# Patient Record
Sex: Female | Born: 1990 | Race: White | Hispanic: No | Marital: Single | Smoking: Former smoker
Health system: Southern US, Community
[De-identification: ages and names within clinical notes are randomized; demographics above are authoritative.]

## PROBLEM LIST (undated history)

## (undated) DIAGNOSIS — K219 Gastro-esophageal reflux disease without esophagitis: Secondary | ICD-10-CM

## (undated) DIAGNOSIS — O039 Complete or unspecified spontaneous abortion without complication: Secondary | ICD-10-CM

## (undated) DIAGNOSIS — F32A Depression, unspecified: Secondary | ICD-10-CM

## (undated) DIAGNOSIS — F419 Anxiety disorder, unspecified: Secondary | ICD-10-CM

## (undated) DIAGNOSIS — E039 Hypothyroidism, unspecified: Secondary | ICD-10-CM

## (undated) DIAGNOSIS — T7840XA Allergy, unspecified, initial encounter: Secondary | ICD-10-CM

## (undated) DIAGNOSIS — E079 Disorder of thyroid, unspecified: Secondary | ICD-10-CM

## (undated) HISTORY — PX: TONSILLECTOMY: SUR1361

## (undated) HISTORY — DX: Allergy, unspecified, initial encounter: T78.40XA

## (undated) HISTORY — DX: Gastro-esophageal reflux disease without esophagitis: K21.9

## (undated) HISTORY — DX: Depression, unspecified: F32.A

---
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
## 2011-01-06 ENCOUNTER — Encounter (INDEPENDENT_AMBULATORY_CARE_PROVIDER_SITE_OTHER): Payer: 59 | Admitting: Psychiatry

## 2011-01-06 DIAGNOSIS — F909 Attention-deficit hyperactivity disorder, unspecified type: Secondary | ICD-10-CM

## 2011-01-06 DIAGNOSIS — F39 Unspecified mood [affective] disorder: Secondary | ICD-10-CM

## 2011-02-03 ENCOUNTER — Encounter (HOSPITAL_COMMUNITY): Payer: 59 | Admitting: Psychiatry

## 2011-08-10 ENCOUNTER — Ambulatory Visit (HOSPITAL_COMMUNITY): Payer: 59 | Admitting: Psychiatry

## 2011-08-11 ENCOUNTER — Ambulatory Visit (HOSPITAL_COMMUNITY): Payer: 59 | Admitting: Psychiatry

## 2012-06-14 ENCOUNTER — Encounter (HOSPITAL_BASED_OUTPATIENT_CLINIC_OR_DEPARTMENT_OTHER): Payer: Self-pay | Admitting: *Deleted

## 2012-06-14 ENCOUNTER — Emergency Department (HOSPITAL_BASED_OUTPATIENT_CLINIC_OR_DEPARTMENT_OTHER)
Admission: EM | Admit: 2012-06-14 | Discharge: 2012-06-14 | Disposition: A | Discharge status: Home / Self Care | Payer: Managed Care, Other (non HMO) | Attending: Emergency Medicine | Admitting: Emergency Medicine

## 2012-06-14 ENCOUNTER — Emergency Department (HOSPITAL_BASED_OUTPATIENT_CLINIC_OR_DEPARTMENT_OTHER): Payer: Managed Care, Other (non HMO)

## 2012-06-14 DIAGNOSIS — R42 Dizziness and giddiness: Secondary | ICD-10-CM | POA: Insufficient documentation

## 2012-06-14 DIAGNOSIS — E079 Disorder of thyroid, unspecified: Secondary | ICD-10-CM | POA: Insufficient documentation

## 2012-06-14 DIAGNOSIS — S8990XA Unspecified injury of unspecified lower leg, initial encounter: Secondary | ICD-10-CM | POA: Insufficient documentation

## 2012-06-14 DIAGNOSIS — M79673 Pain in unspecified foot: Secondary | ICD-10-CM

## 2012-06-14 DIAGNOSIS — Z79899 Other long term (current) drug therapy: Secondary | ICD-10-CM | POA: Insufficient documentation

## 2012-06-14 DIAGNOSIS — M25579 Pain in unspecified ankle and joints of unspecified foot: Secondary | ICD-10-CM

## 2012-06-14 DIAGNOSIS — Y9389 Activity, other specified: Secondary | ICD-10-CM | POA: Insufficient documentation

## 2012-06-14 HISTORY — DX: Disorder of thyroid, unspecified: E07.9

## 2012-06-14 MED ORDER — HYDROCODONE-ACETAMINOPHEN 5-325 MG PO TABS: 2.0000 | ORAL_TABLET | ORAL | Status: DC | PRN

## 2012-06-14 MED ORDER — HYDROCODONE-ACETAMINOPHEN 5-325 MG PO TABS
1.0000 | ORAL_TABLET | Freq: Once | ORAL | Status: AC
  Administered 2012-06-14: 1 via ORAL
  Filled 2012-06-14: qty 1
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
## 2012-06-14 NOTE — ED Provider Notes (Signed)
Medical screening examination/treatment/procedure(s) were performed by non-physician practitioner and as supervising physician I was immediately available for consultation/collaboration.   Anu Stagner, MD 06/14/12 2317 

## 2012-11-14 ENCOUNTER — Other Ambulatory Visit: Payer: Self-pay | Admitting: Family Medicine

## 2012-11-14 DIAGNOSIS — R102 Pelvic and perineal pain: Secondary | ICD-10-CM

## 2012-11-21 ENCOUNTER — Other Ambulatory Visit: Payer: Managed Care, Other (non HMO)

## 2012-12-05 ENCOUNTER — Ambulatory Visit
Admission: RE | Admit: 2012-12-05 | Discharge: 2012-12-05 | Disposition: A | Discharge status: Home / Self Care | Payer: Managed Care, Other (non HMO) | Source: Ambulatory Visit | Attending: Family Medicine | Admitting: Family Medicine

## 2012-12-05 DIAGNOSIS — R102 Pelvic and perineal pain: Secondary | ICD-10-CM

## 2013-10-31 ENCOUNTER — Encounter (HOSPITAL_COMMUNITY): Payer: Self-pay | Admitting: *Deleted

## 2013-11-02 ENCOUNTER — Encounter (HOSPITAL_COMMUNITY): Payer: Self-pay | Admitting: Pharmacist

## 2013-11-02 MED ORDER — DOXYCYCLINE HYCLATE 100 MG IV SOLR
100.0000 mg | Freq: Once | INTRAVENOUS | Status: AC
  Administered 2013-11-03: 100 mg via INTRAVENOUS
  Filled 2013-11-02: qty 100
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
## 2013-11-03 ENCOUNTER — Encounter (HOSPITAL_COMMUNITY): Payer: Managed Care, Other (non HMO) | Admitting: Anesthesiology

## 2013-11-03 ENCOUNTER — Ambulatory Visit (HOSPITAL_COMMUNITY): Payer: Managed Care, Other (non HMO) | Admitting: Anesthesiology

## 2013-11-03 ENCOUNTER — Encounter (HOSPITAL_COMMUNITY): Payer: Self-pay | Admitting: Anesthesiology

## 2013-11-03 ENCOUNTER — Ambulatory Visit (HOSPITAL_COMMUNITY)
Admission: RE | Admit: 2013-11-03 | Discharge: 2013-11-03 | Disposition: A | Discharge status: Home / Self Care | Payer: Managed Care, Other (non HMO) | Source: Ambulatory Visit | Attending: Obstetrics & Gynecology | Admitting: Obstetrics & Gynecology

## 2013-11-03 ENCOUNTER — Encounter (HOSPITAL_COMMUNITY)
Admission: RE | Disposition: A | Discharge status: Home / Self Care | Payer: Self-pay | Source: Ambulatory Visit | Attending: Obstetrics & Gynecology

## 2013-11-03 DIAGNOSIS — E0500 Thyrotoxicosis with diffuse goiter without thyrotoxic crisis or storm: Secondary | ICD-10-CM | POA: Insufficient documentation

## 2013-11-03 DIAGNOSIS — F411 Generalized anxiety disorder: Secondary | ICD-10-CM | POA: Insufficient documentation

## 2013-11-03 DIAGNOSIS — O021 Missed abortion: Secondary | ICD-10-CM | POA: Insufficient documentation

## 2013-11-03 DIAGNOSIS — E039 Hypothyroidism, unspecified: Secondary | ICD-10-CM | POA: Insufficient documentation

## 2013-11-03 HISTORY — DX: Complete or unspecified spontaneous abortion without complication: O03.9

## 2013-11-03 HISTORY — DX: Anxiety disorder, unspecified: F41.9

## 2013-11-03 HISTORY — PX: DILATION AND EVACUATION: SHX1459

## 2013-11-03 HISTORY — DX: Hypothyroidism, unspecified: E03.9

## 2013-11-03 LAB — CBC
HEMATOCRIT: 41.6 % (ref 36.0–46.0)
Hemoglobin: 14.6 g/dL (ref 12.0–15.0)
MCH: 30.4 pg (ref 26.0–34.0)
MCHC: 35.1 g/dL (ref 30.0–36.0)
MCV: 86.7 fL (ref 78.0–100.0)
Platelets: 292 10*3/uL (ref 150–400)
RBC: 4.8 MIL/uL (ref 3.87–5.11)
RDW: 12.2 % (ref 11.5–15.5)
WBC: 10.9 10*3/uL — AB (ref 4.0–10.5)

## 2013-11-03 SURGERY — DILATION AND EVACUATION, UTERUS
Anesthesia: Monitor Anesthesia Care | Site: Vagina

## 2013-11-03 MED ORDER — OXYCODONE-ACETAMINOPHEN 5-325 MG PO TABS
1.0000 | ORAL_TABLET | ORAL | Status: DC | PRN
  Administered 2013-11-03: 1 via ORAL

## 2013-11-03 MED ORDER — ALPRAZOLAM 0.5 MG PO TABS: 0.5000 mg | ORAL_TABLET | Freq: Three times a day (TID) | ORAL | Status: DC | PRN

## 2013-11-03 MED ORDER — CHLOROPROCAINE HCL 1 % IJ SOLN
INTRAMUSCULAR | Status: AC
  Filled 2013-11-03: qty 30

## 2013-11-03 MED ORDER — FENTANYL CITRATE 0.05 MG/ML IJ SOLN: 25.0000 ug | INTRAMUSCULAR | Status: DC | PRN

## 2013-11-03 MED ORDER — LIDOCAINE HCL (CARDIAC) 20 MG/ML IV SOLN
INTRAVENOUS | Status: AC
  Filled 2013-11-03: qty 5

## 2013-11-03 MED ORDER — FENTANYL CITRATE 0.05 MG/ML IJ SOLN
INTRAMUSCULAR | Status: AC
  Filled 2013-11-03: qty 2

## 2013-11-03 MED ORDER — DOXYCYCLINE HYCLATE 100 MG PO TABS
200.0000 mg | ORAL_TABLET | Freq: Every day | ORAL | Status: AC
  Administered 2013-11-03: 200 mg via ORAL

## 2013-11-03 MED ORDER — MIDAZOLAM HCL 2 MG/2ML IJ SOLN
INTRAMUSCULAR | Status: DC | PRN
  Administered 2013-11-03: 2 mg via INTRAVENOUS

## 2013-11-03 MED ORDER — LIDOCAINE HCL (CARDIAC) 20 MG/ML IV SOLN
INTRAVENOUS | Status: DC | PRN
  Administered 2013-11-03: 80 mg via INTRAVENOUS

## 2013-11-03 MED ORDER — KETOROLAC TROMETHAMINE 30 MG/ML IJ SOLN: 15.0000 mg | Freq: Once | INTRAMUSCULAR | Status: DC | PRN

## 2013-11-03 MED ORDER — PROPOFOL 10 MG/ML IV BOLUS
INTRAVENOUS | Status: DC | PRN
  Administered 2013-11-03 (×2): 20 mg via INTRAVENOUS
  Administered 2013-11-03: 10 mg via INTRAVENOUS
  Administered 2013-11-03: 130 mg via INTRAVENOUS
  Administered 2013-11-03: 20 mg via INTRAVENOUS

## 2013-11-03 MED ORDER — IBUPROFEN 800 MG PO TABS: 800.0000 mg | ORAL_TABLET | Freq: Four times a day (QID) | ORAL | Status: DC | PRN

## 2013-11-03 MED ORDER — DEXAMETHASONE SODIUM PHOSPHATE 10 MG/ML IJ SOLN
INTRAMUSCULAR | Status: DC | PRN
  Administered 2013-11-03: 10 mg via INTRAVENOUS

## 2013-11-03 MED ORDER — DOXYCYCLINE HYCLATE 100 MG PO TABS
ORAL_TABLET | ORAL | Status: AC
  Administered 2013-11-03: 200 mg via ORAL
  Filled 2013-11-03: qty 2

## 2013-11-03 MED ORDER — DEXAMETHASONE SODIUM PHOSPHATE 10 MG/ML IJ SOLN
INTRAMUSCULAR | Status: AC
  Filled 2013-11-03: qty 1

## 2013-11-03 MED ORDER — DEXTROSE IN LACTATED RINGERS 5 % IV SOLN: INTRAVENOUS | Status: DC

## 2013-11-03 MED ORDER — ONDANSETRON HCL 4 MG/2ML IJ SOLN
INTRAMUSCULAR | Status: DC | PRN
  Administered 2013-11-03: 4 mg via INTRAVENOUS

## 2013-11-03 MED ORDER — PROPOFOL INFUSION 10 MG/ML OPTIME
INTRAVENOUS | Status: DC | PRN
  Administered 2013-11-03: 120 ug/kg/min via INTRAVENOUS

## 2013-11-03 MED ORDER — OXYCODONE-ACETAMINOPHEN 5-325 MG PO TABS
ORAL_TABLET | ORAL | Status: AC
  Filled 2013-11-03: qty 1

## 2013-11-03 MED ORDER — MEPERIDINE HCL 25 MG/ML IJ SOLN
6.2500 mg | INTRAMUSCULAR | Status: DC | PRN
Start: 2013-11-03 — End: 2013-11-03

## 2013-11-03 MED ORDER — KETOROLAC TROMETHAMINE 30 MG/ML IJ SOLN
INTRAMUSCULAR | Status: DC | PRN
  Administered 2013-11-03: 30 mg via INTRAVENOUS

## 2013-11-03 MED ORDER — MIDAZOLAM HCL 2 MG/2ML IJ SOLN
INTRAMUSCULAR | Status: AC
  Filled 2013-11-03: qty 2

## 2013-11-03 MED ORDER — PROMETHAZINE HCL 25 MG/ML IJ SOLN: 6.2500 mg | INTRAMUSCULAR | Status: DC | PRN

## 2013-11-03 MED ORDER — OXYCODONE-ACETAMINOPHEN 7.5-325 MG PO TABS: 1.0000 | ORAL_TABLET | ORAL | Status: DC | PRN

## 2013-11-03 MED ORDER — ONDANSETRON HCL 4 MG PO TABS: 4.0000 mg | ORAL_TABLET | Freq: Three times a day (TID) | ORAL | Status: DC | PRN

## 2013-11-03 MED ORDER — MIDAZOLAM HCL 2 MG/2ML IJ SOLN: 0.5000 mg | Freq: Once | INTRAMUSCULAR | Status: DC | PRN

## 2013-11-03 MED ORDER — LACTATED RINGERS IV SOLN
INTRAVENOUS | Status: DC
  Administered 2013-11-03: 13:00:00 via INTRAVENOUS

## 2013-11-03 MED ORDER — PROPOFOL 10 MG/ML IV EMUL
INTRAVENOUS | Status: AC
  Filled 2013-11-03: qty 40

## 2013-11-03 MED ORDER — FENTANYL CITRATE 0.05 MG/ML IJ SOLN
INTRAMUSCULAR | Status: DC | PRN
  Administered 2013-11-03 (×2): 50 ug via INTRAVENOUS
  Administered 2013-11-03: 100 ug via INTRAVENOUS

## 2013-11-03 MED ORDER — CHLOROPROCAINE HCL 1 % IJ SOLN
INTRAMUSCULAR | Status: DC | PRN
  Administered 2013-11-03: 30 mL

## 2013-11-03 MED ORDER — ONDANSETRON HCL 4 MG/2ML IJ SOLN
INTRAMUSCULAR | Status: AC
  Filled 2013-11-03: qty 2

## 2013-11-03 MED ORDER — KETOROLAC TROMETHAMINE 30 MG/ML IJ SOLN: INTRAMUSCULAR | Status: DC | PRN

## 2013-11-03 SURGICAL SUPPLY — 21 items
CATH ROBINSON RED A/P 16FR (CATHETERS) ×3 IMPLANT
CLOTH BEACON ORANGE TIMEOUT ST (SAFETY) ×3 IMPLANT
DECANTER SPIKE VIAL GLASS SM (MISCELLANEOUS) ×3 IMPLANT
GLOVE BIO SURGEON STRL SZ 6 (GLOVE) ×3 IMPLANT
GLOVE BIOGEL PI IND STRL 6 (GLOVE) ×2 IMPLANT
GLOVE BIOGEL PI INDICATOR 6 (GLOVE) ×4
GOWN STRL REUS W/TWL LRG LVL3 (GOWN DISPOSABLE) ×6 IMPLANT
KIT BERKELEY 1ST TRIMESTER 3/8 (MISCELLANEOUS) ×3 IMPLANT
NDL SPNL 22GX3.5 QUINCKE BK (NEEDLE) ×1 IMPLANT
NEEDLE SPNL 22GX3.5 QUINCKE BK (NEEDLE) ×3 IMPLANT
NS IRRIG 1000ML POUR BTL (IV SOLUTION) ×3 IMPLANT
PACK VAGINAL MINOR WOMEN LF (CUSTOM PROCEDURE TRAY) ×3 IMPLANT
PAD OB MATERNITY 4.3X12.25 (PERSONAL CARE ITEMS) ×3 IMPLANT
PAD PREP 24X48 CUFFED NSTRL (MISCELLANEOUS) ×3 IMPLANT
SET BERKELEY SUCTION TUBING (SUCTIONS) ×3 IMPLANT
SYR CONTROL 10ML LL (SYRINGE) ×3 IMPLANT
TOWEL OR 17X24 6PK STRL BLUE (TOWEL DISPOSABLE) ×6 IMPLANT
VACURETTE 10 RIGID CVD (CANNULA) IMPLANT
VACURETTE 7MM CVD STRL WRAP (CANNULA) IMPLANT
VACURETTE 8 RIGID CVD (CANNULA) IMPLANT
VACURETTE 9 RIGID CVD (CANNULA) ×2 IMPLANT

## 2013-11-03 NOTE — Op Note (Signed)
Patient: Michele Bullock DOB: Mar 02, 1991 PREOPERATIVE DIAGNOSIS: Missed abortion at 8 weeks  POSTOPERATIVE DIAGNOSIS: The same  PROCEDURE: Suction Dilation and Curettage.  SURGEON: Dr. Mitchel HonourMegan Everlyn Farabaugh  INDICATIONS: 23 y.o. G2P0 here for scheduled surgery for suction D&C for missed abortion at 8 weeks. Risks of surgery were discussed with the patient including but not limited to: bleeding which may require transfusion; infection which may require antibiotics; injury to uterus or surrounding organs; intrauterine scarring which may impair future fertility; need for additional procedures including laparotomy or laparoscopy; and other postoperative/anesthesia complications. Written informed consent was obtained.  FINDINGS: A 8 week size uterus.  ANESTHESIA: General  ESTIMATED BLOOD LOSS: 100cc  SPECIMENS: Products of conception sent for genetic testing COMPLICATIONS: None immediate.  PROCEDURE DETAILS: The patient received intravenous antibiotics while in the preoperative area. She was then taken to the operating room where general anesthesia was administered and was found to be adequate. After an adequate timeout was performed, she was placed in the dorsal lithotomy position and examined; then prepped and draped in the sterile manner. Her bladder was catheterized for an unmeasured amount of clear, yellow urine. A speculum was then placed in the patient's vagina and a single tooth tenaculum was applied to the anterior lip of the cervix. The uteus was dilated manually with Pratt cervical dilators up to 27 [place]. Once the cervix was dilated, 9 mm suction curette was advanced and suction attached. Three suction curettage passes were performed which did remove tissue. A sharp curettage was then performed to obtain a scant amount of endometrial curettings and revealed a gritty texture circumferentially. The tenaculum was removed from the anterior lip of the cervix and the vaginal speculum was removed after noting good  hemostasis. The patient tolerated the procedure well and was taken to the recovery area awake, extubated and in stable condition.

## 2013-11-03 NOTE — Progress Notes (Signed)
No change to H&P. 

## 2013-11-03 NOTE — Transfer of Care (Signed)
Immediate Anesthesia Transfer of Care Note  Patient: Michele Bullock  Procedure(s) Performed: Procedure(s) with comments: DILATATION AND EVACUATION with genetic studies (N/A) - with genetic studies  Patient Location: PACU  Anesthesia Type:General  Level of Consciousness: awake, alert  and oriented  Airway & Oxygen Therapy: Patient Spontanous Breathing  Post-op Assessment: Report given to PACU RN and Post -op Vital signs reviewed and stable  Post vital signs: Reviewed and stable  Complications: No apparent anesthesia complications

## 2013-11-03 NOTE — Anesthesia Preprocedure Evaluation (Signed)
Anesthesia Evaluation  Patient identified by MRN, date of birth, ID band Patient awake    Reviewed: Allergy & Precautions, H&P , Patient's Chart, lab work & pertinent test results, reviewed documented beta blocker date and time   History of Anesthesia Complications Negative for: history of anesthetic complications  Airway Mallampati: II TM Distance: >3 FB Neck ROM: full    Dental   Pulmonary  breath sounds clear to auscultation        Cardiovascular Exercise Tolerance: Good Rhythm:regular Rate:Normal     Neuro/Psych PSYCHIATRIC DISORDERS Anxiety negative psych ROS   GI/Hepatic   Endo/Other  Hypothyroidism   Renal/GU      Musculoskeletal   Abdominal   Peds  Hematology   Anesthesia Other Findings   Reproductive/Obstetrics                           Anesthesia Physical Anesthesia Plan  ASA: II  Anesthesia Plan: MAC   Post-op Pain Management:    Induction:   Airway Management Planned:   Additional Equipment:   Intra-op Plan:   Post-operative Plan:   Informed Consent: I have reviewed the patients History and Physical, chart, labs and discussed the procedure including the risks, benefits and alternatives for the proposed anesthesia with the patient or authorized representative who has indicated his/her understanding and acceptance.   Dental Advisory Given  Plan Discussed with: CRNA, Surgeon and Anesthesiologist  Anesthesia Plan Comments:         Anesthesia Quick Evaluation

## 2013-11-03 NOTE — Discharge Instructions (Signed)
Call MD for T>100.4, severe abdominal pain, intractable nausea and/or vomiting, or heavy vaginal bleeding.  Call office to schedule postop appointment in 2 weeks.  No driving while taking narcotics.  Pelvic rest x 2 weeks.DISCHARGE INSTRUCTIONS: D&C / D&E The following instructions have been prepared to help you care for yourself upon your return home.   Personal hygiene:  Use sanitary pads for vaginal drainage, not tampons.  Shower the day after your procedure.  NO tub baths, pools or Jacuzzis for 2-3 weeks.  Wipe front to back after using the bathroom.  Activity and limitations:  Do NOT drive or operate any equipment for 24 hours. The effects of anesthesia are still present and drowsiness may result.  Do NOT rest in bed all day.  Walking is encouraged.  Walk up and down stairs slowly.  You may resume your normal activity in one to two days or as indicated by your physician.  Sexual activity: NO intercourse for at least 2 weeks after the procedure, or as indicated by your physician.  Diet: Eat a light meal as desired this evening. You may resume your usual diet tomorrow.  Return to work: You may resume your work activities in one to two days or as indicated by your doctor.  What to expect after your surgery: Expect to have vaginal bleeding/discharge for 2-3 days and spotting for up to 10 days. It is not unusual to have soreness for up to 1-2 weeks. You may have a slight burning sensation when you urinate for the first day. Mild cramps may continue for a couple of days. You may have a regular period in 2-6 weeks.  Call your doctor for any of the following:  Excessive vaginal bleeding, saturating and changing one pad every hour.  Inability to urinate 6 hours after discharge from hospital.  Pain not relieved by pain medication.  Fever of 100.4 F or greater.  Unusual vaginal discharge or odor.   Call for an appointment:    Patients signature:  ______________________  Nurses signature ________________________  Support person's signature_______________________

## 2013-11-06 ENCOUNTER — Encounter (HOSPITAL_COMMUNITY): Payer: Self-pay | Admitting: Obstetrics & Gynecology

## 2013-11-08 NOTE — Anesthesia Postprocedure Evaluation (Signed)
  Anesthesia Post Note  Patient: Michele Bullock  Procedure(s) Performed: Procedure(s) (LRB): DILATATION AND EVACUATION with genetic studies (N/A)  Anesthesia type: GA  Patient location: PACU  Post pain: Pain level controlled  Post assessment: Post-op Vital signs reviewed  Last Vitals:  Filed Vitals:   11/03/13 1506  BP:   Pulse: 84  Temp: 36.7 C  Resp: 16    Post vital signs: Reviewed  Level of consciousness: sedated  Complications: No apparent anesthesia complications

## 2013-11-20 LAB — CHROMOSOME STD, POC(TISSUE)-NCBH

## 2014-05-21 ENCOUNTER — Encounter (HOSPITAL_COMMUNITY): Payer: Self-pay | Admitting: Obstetrics & Gynecology

## 2014-09-28 IMAGING — US US PELVIS COMPLETE
1 series · 14 of 25 positions shown · non-contrast
Comparison: 05/13/2010.

CLINICAL DATA: Right pelvic pain.

TRANSABDOMINAL AND TRANSVAGINAL ULTRASOUND OF PELVIS
TECHNIQUE: Both transabdominal and transvaginal ultrasound
examinations of the pelvis were performed. Transabdominal technique
was performed for global imaging of the pelvis including uterus,
ovaries, adnexal regions, and pelvic cul-de-sac.
It was necessary to proceed with endovaginal exam following the
transabdominal exam to visualize the uterus, endometrium, ovaries
and adnexal regions.

[Series 1: us pelvis complete · 0.19mm/px · 14 of 83 slices shown]
[im 1/83]
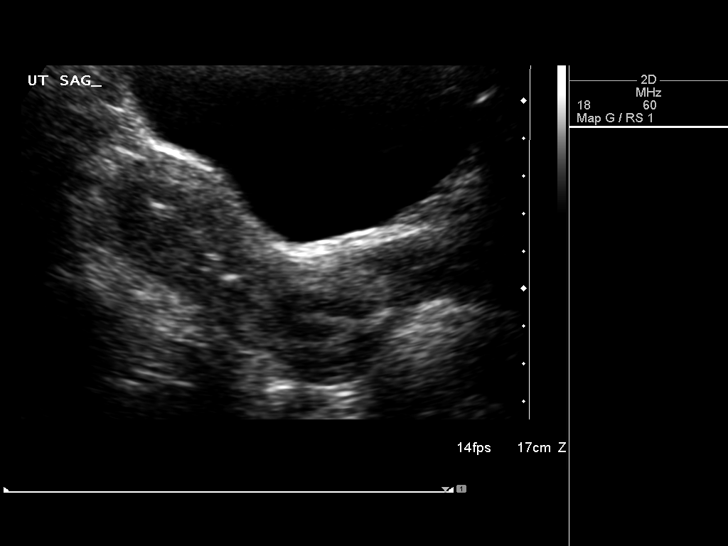
[im 7/83]
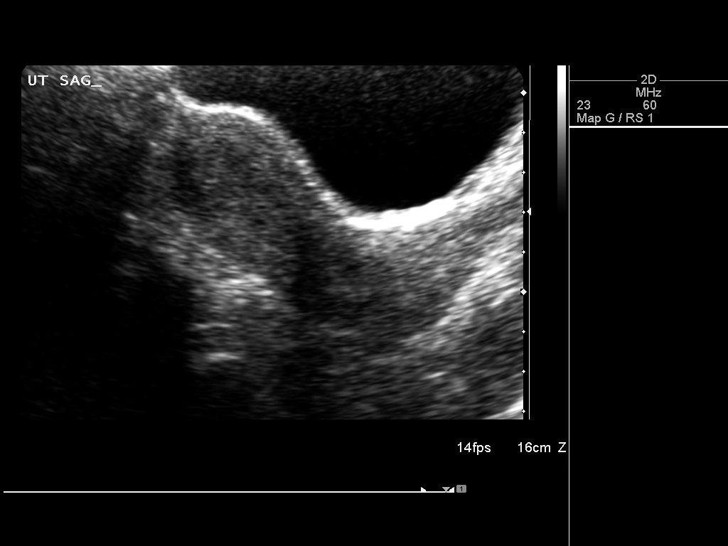
[im 14/83]
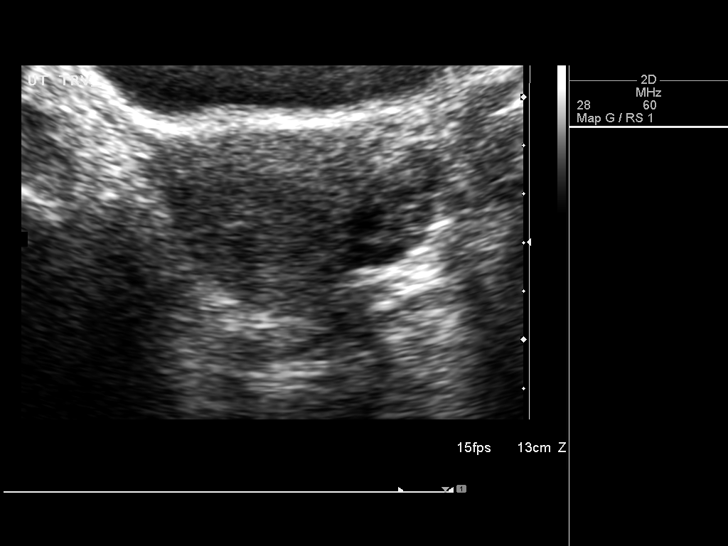
[im 21/83]
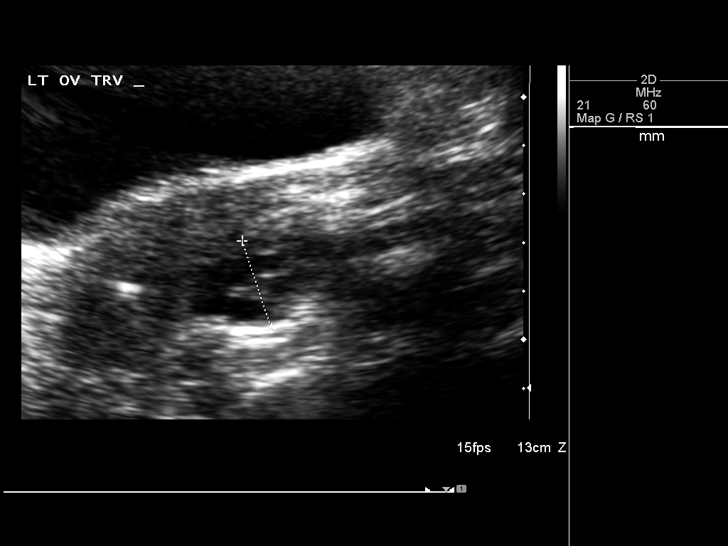
[im 28/83]
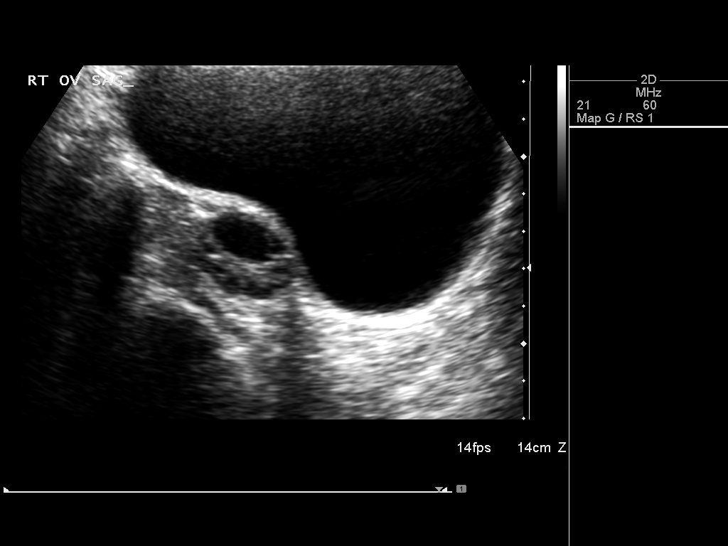
[im 31/83]
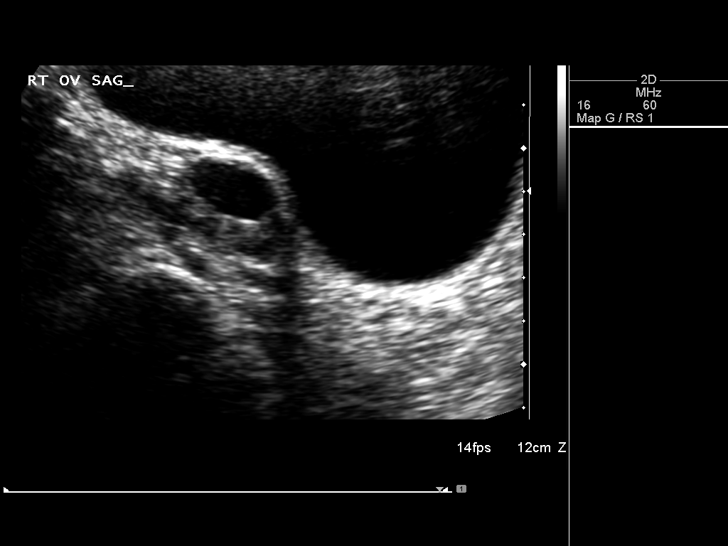
[im 38/83]
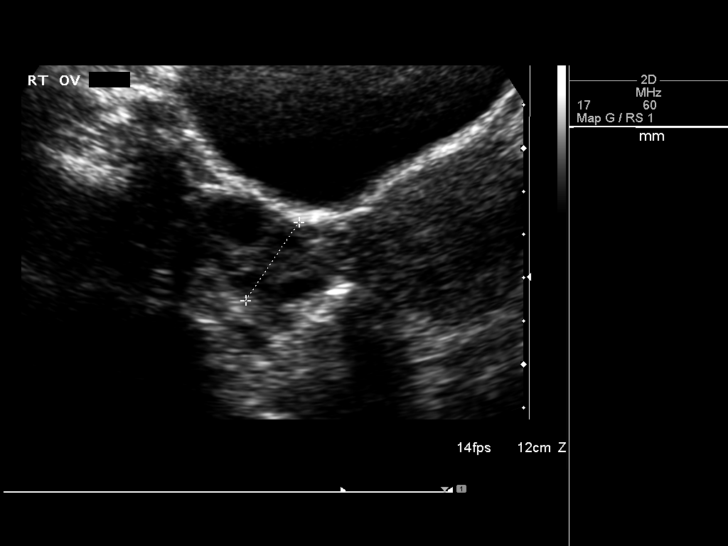
[im 45/83]
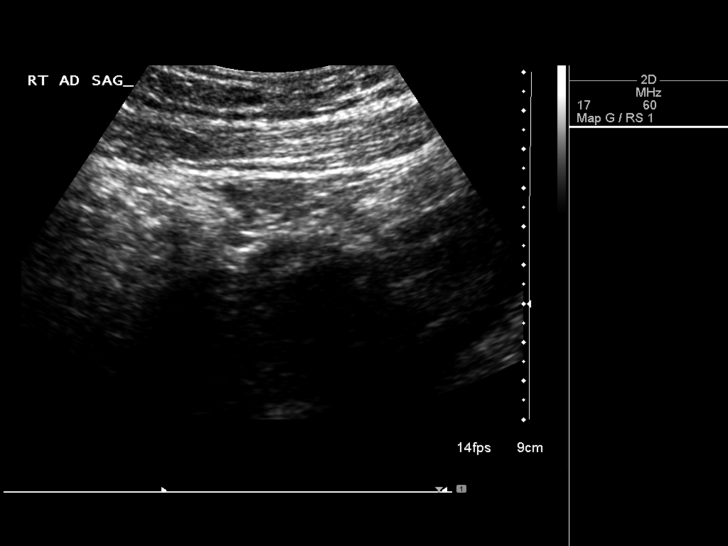
[im 52/83]
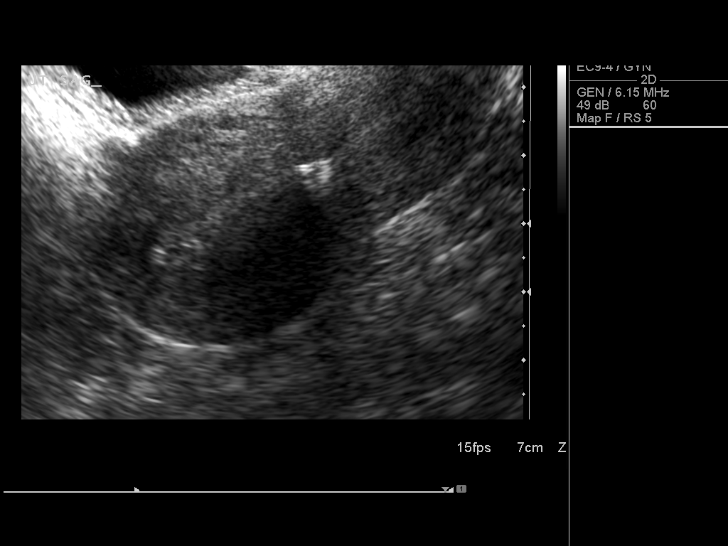
[im 55/83]
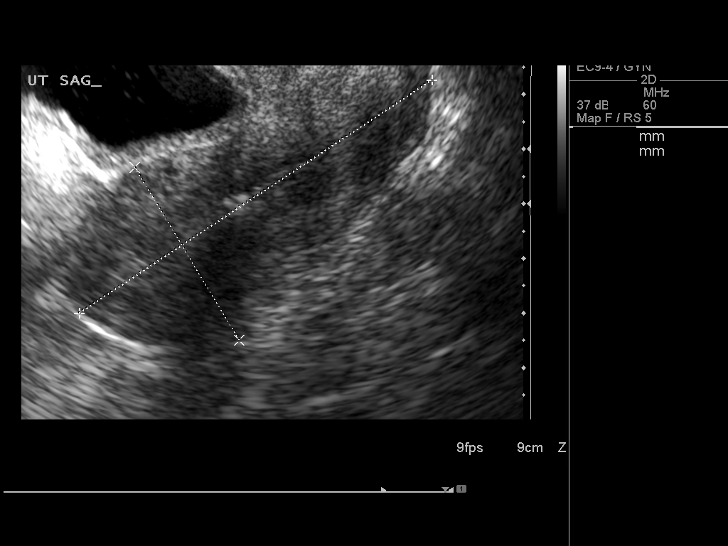
[im 62/83]
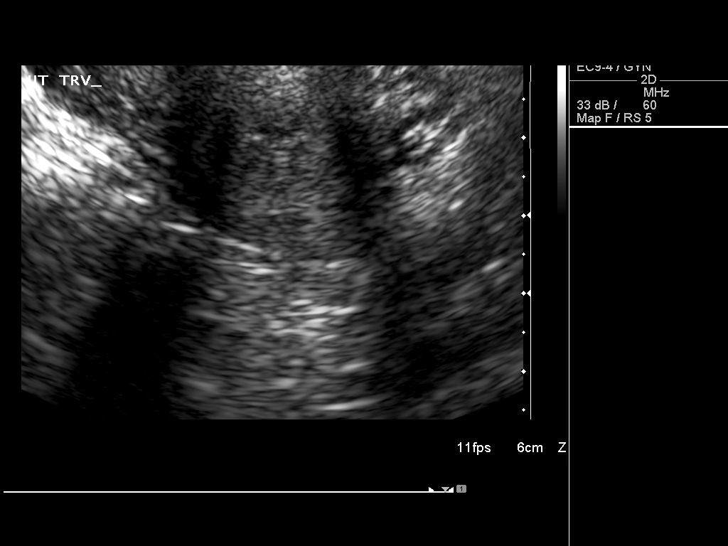
[im 69/83]
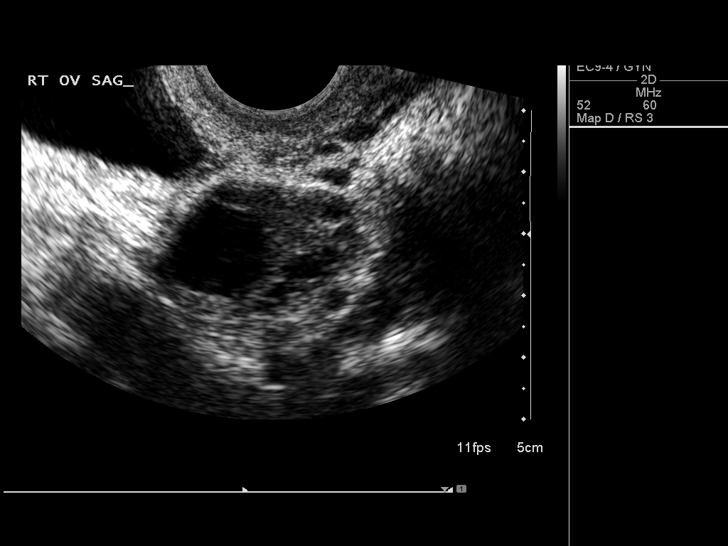
[im 76/83]
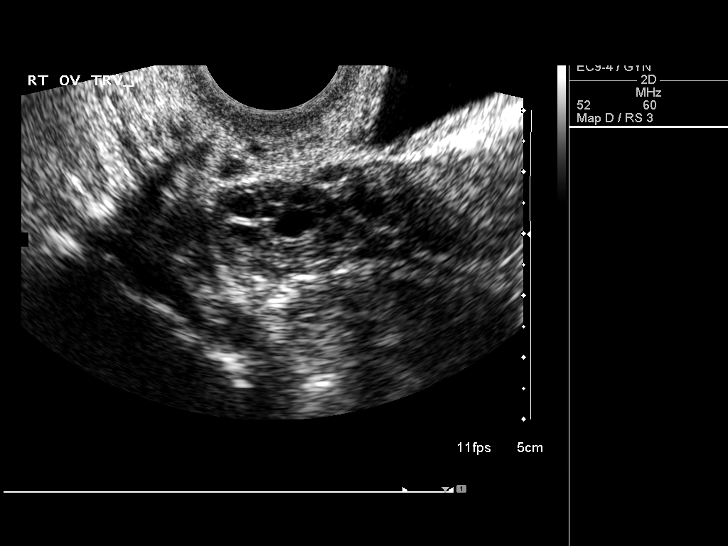
[im 83/83]
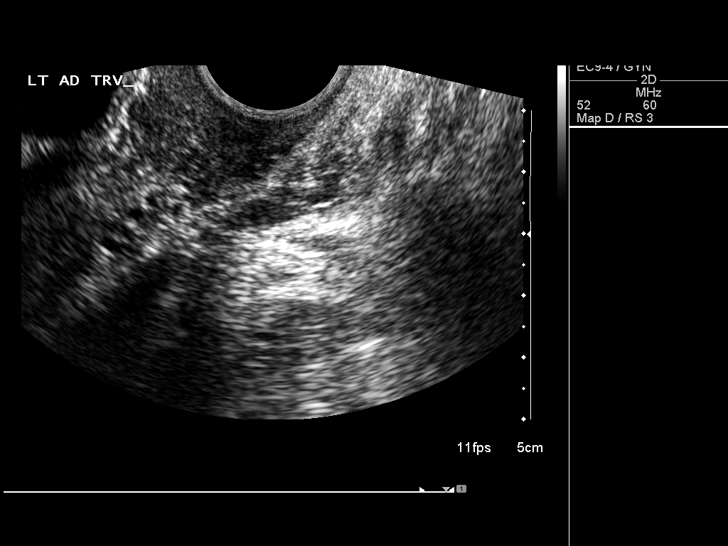

[14 of 25 positions shown; findings below may reference images not displayed]

FINDINGS: Uterus: Measures 8.6 x 3.2 x 4.8 cm.  Parenchymal echogenicity is
uniform.

Endometrium: Measures 5 mm.  Echogenic intrauterine contraceptive
device is seen within the endometrial canal, with portions of the
device seen in the fundus, as expected.

Right ovary:  Measures 3.4 x 2.1 x 2.1 cm, negative.

Left ovary: Measures 2.7 x 2.1 x 1.9 cm, negative.

Other findings: No free fluid.
IMPRESSION: Normal study. No evidence of pelvic mass or other significant
abnormality.

## 2015-05-30 ENCOUNTER — Encounter: Payer: Self-pay | Admitting: Endocrinology

## 2015-05-30 ENCOUNTER — Ambulatory Visit (INDEPENDENT_AMBULATORY_CARE_PROVIDER_SITE_OTHER): Payer: Managed Care, Other (non HMO) | Admitting: Endocrinology

## 2015-05-30 VITALS — BP 128/80 | HR 121 | Temp 98.6°F | Ht 64.75 in | Wt 191.0 lb

## 2015-05-30 DIAGNOSIS — Z0189 Encounter for other specified special examinations: Secondary | ICD-10-CM | POA: Diagnosis not present

## 2015-05-30 DIAGNOSIS — Z0000 Encounter for general adult medical examination without abnormal findings: Secondary | ICD-10-CM

## 2015-05-30 DIAGNOSIS — E039 Hypothyroidism, unspecified: Secondary | ICD-10-CM | POA: Diagnosis not present

## 2015-05-30 DIAGNOSIS — R609 Edema, unspecified: Secondary | ICD-10-CM | POA: Diagnosis not present

## 2015-05-30 NOTE — Progress Notes (Signed)
Subjective:    Patient ID: Michele Bullock, female    DOB: 12/27/1990, 24 y.o.   MRN: [id number]  HPI Pt reports hypothyroidism was dx'ed in 2012.  she has been on prescribed thyroid hormone therapy since then.  she has never taken kelp or any other type of non-prescribed thyroid product.  she has never had thyroid imaging.  She is not considering a pregnancy.  He has never had thyroid surgery, or XRT to the neck.  He has never been on amiodarone or lithium.   Pt states slight swelling throughout the body, and assoc fatigue.  Past Medical History  Diagnosis Date  . Thyroid disease   . Hypothyroidism   . Anxiety   . Miscarriage 2011    Past Surgical History  Procedure Laterality Date  . Tonsillectomy    . Dilation and evacuation N/A 11/03/2013    Procedure: DILATATION AND EVACUATION with genetic studies;  Surgeon: Mitchel HonourMegan Morris, DO;  Location: [facility];  Service: Gynecology;  Laterality: N/A;  with genetic studies    Social History   Social History  . Marital Status: Single    Spouse Name: N/A  . Number of Children: N/A  . Years of Education: N/A   Occupational History  . Not on file.   Social History Main Topics  . Smoking status: Never Smoker   . Smokeless tobacco: Not on file  . Alcohol Use: Yes     Comment: socially  . Drug Use: No  . Sexual Activity: Yes    Birth Control/ Protection: IUD   Other Topics Concern  . Not on file   Social History Narrative    Current Outpatient Prescriptions on File Prior to Visit  Medication Sig Dispense Refill  . levothyroxine (SYNTHROID, LEVOTHROID) 50 MCG tablet Take 50 mcg by mouth daily.     No current facility-administered medications on file prior to visit.    Allergies  Allergen Reactions  . Valtrex [Valacyclovir Hcl] Anaphylaxis    Family History  Problem Relation Age of Onset  . Hypothyroidism Maternal Aunt     BP 128/80 mmHg  Pulse 121  Temp(Src) 98.6 F (37 C) (Oral)  Ht 5' 4.75" (1.645 m)  Wt 191 lb  (86.637 kg)  BMI 32.02 kg/m2  SpO2 92%  Review of Systems  denies depression, hair loss, muscle cramps, sob, weight gain, constipation, numbness, blurry vision, cold intolerance, myalgias, dry skin, rhinorrhea, easy bruising, and syncope.       Objective:   Physical Exam VS: see vs page GEN: no distress HEAD: head: no deformity eyes: no periorbital swelling, no proptosis external nose and ears are normal mouth: no lesion seen NECK: supple, thyroid is not enlarged CHEST WALL: no deformity LUNGS:  Clear to auscultation CV: reg rate and rhythm, no murmur ABD: abdomen is soft, nontender.  no hepatosplenomegaly.  not distended.  no hernia. MUSCULOSKELETAL: muscle bulk and strength are grossly normal.  no obvious joint swelling.  gait is normal and steady EXTEMITIES: no deformity.  no ulcer on the feet.  feet are of normal color and temp.  no edema PULSES: dorsalis pedis intact bilat.  no carotid bruit NEURO:  cn 2-12 grossly intact.   readily moves all 4's.  sensation is intact to touch on the feet SKIN:  Normal texture and temperature.  No rash or suspicious lesion is visible.   NODES:  None palpable at the neck PSYCH: alert, well-oriented.  Does not appear anxious nor depressed.   I have reviewed  outside records, and summarized: Pt was noted to have hypothyroidism, and referred here.    Radiol: (11/25/06): CXR: no mention is made of a goiter.   Lab Results  Component Value Date   TSH 0.42 05/30/2015      Assessment & Plan:  Hypothyroidism, new to me, well-replaced.  pt has signed release of info for [place].   Patient is advised the following: Patient Instructions  blood tests are requested for you today.  We'll let you know about the results.  Please continue the same medications.  Please return in 1 year.  Please call sooner if you feel different.   In view of your medical condition, you should avoid pregnancy until we have decided it is safe.      Hypothyroidism Hypothyroidism is a disorder of the thyroid. The thyroid is a large gland that is located in the lower front of the neck. The thyroid releases hormones that control how the body works. With hypothyroidism, the thyroid does not make enough of these hormones. CAUSES Causes of hypothyroidism may include:  Viral infections.  Pregnancy.  Your own defense system (immune system) attacking your thyroid.  Certain medicines.  Birth defects.  Past radiation treatments to your head or neck.  Past treatment with radioactive iodine.  Past surgical removal of part or all of your thyroid.  Problems with the gland that is located in the center of your brain (pituitary). SIGNS AND SYMPTOMS Signs and symptoms of hypothyroidism may include:  Feeling as though you have no energy (lethargy).  Inability to tolerate cold.  Weight gain that is not explained by a change in diet or exercise habits.  Dry skin.  Coarse hair.  Menstrual irregularity.  Slowing of thought processes.  Constipation.  Sadness or depression. DIAGNOSIS  Your health care provider may diagnose hypothyroidism with blood tests and ultrasound tests. TREATMENT Hypothyroidism is treated with medicine that replaces the hormones that your body does not make. After you begin treatment, it may take several weeks for symptoms to go away. HOME CARE INSTRUCTIONS   Take medicines only as directed by your health care provider.  If you start taking any new medicines, tell your health care provider.  Keep all follow-up visits as directed by your health care provider. This is important. As your condition improves, your dosage needs may change. You will need to have blood tests regularly so that your health care provider can watch your condition. SEEK MEDICAL CARE IF:  Your symptoms do not get better with treatment.  You are taking thyroid replacement medicine and:  You sweat excessively.  You have  tremors.  You feel anxious.  You lose weight rapidly.  You cannot tolerate heat.  You have emotional swings.  You have diarrhea.  You feel weak. SEEK IMMEDIATE MEDICAL CARE IF:   You develop chest pain.  You develop an irregular heartbeat.  You develop a rapid heartbeat.   This information is not intended to replace advice given to you by your health care provider. Make sure you discuss any questions you have with your health care provider.   Document Released: 07/06/2005 Document Revised: 07/27/2014 Document Reviewed: 11/21/2013 Elsevier Interactive Patient Education [employer]

## 2015-05-30 NOTE — Patient Instructions (Addendum)
blood tests are requested for you today.  We'll let you know about the results.  Please continue the same medications.  Please return in 1 year.  Please call sooner if you feel different.   In view of your medical condition, you should avoid pregnancy until we have decided it is safe.     Hypothyroidism Hypothyroidism is a disorder of the thyroid. The thyroid is a large gland that is located in the lower front of the neck. The thyroid releases hormones that control how the body works. With hypothyroidism, the thyroid does not make enough of these hormones. CAUSES Causes of hypothyroidism may include:  Viral infections.  Pregnancy.  Your own defense system (immune system) attacking your thyroid.  Certain medicines.  Birth defects.  Past radiation treatments to your head or neck.  Past treatment with radioactive iodine.  Past surgical removal of part or all of your thyroid.  Problems with the gland that is located in the center of your brain (pituitary). SIGNS AND SYMPTOMS Signs and symptoms of hypothyroidism may include:  Feeling as though you have no energy (lethargy).  Inability to tolerate cold.  Weight gain that is not explained by a change in diet or exercise habits.  Dry skin.  Coarse hair.  Menstrual irregularity.  Slowing of thought processes.  Constipation.  Sadness or depression. DIAGNOSIS  Your health care provider may diagnose hypothyroidism with blood tests and ultrasound tests. TREATMENT Hypothyroidism is treated with medicine that replaces the hormones that your body does not make. After you begin treatment, it may take several weeks for symptoms to go away. HOME CARE INSTRUCTIONS   Take medicines only as directed by your health care provider.  If you start taking any new medicines, tell your health care provider.  Keep all follow-up visits as directed by your health care provider. This is important. As your condition improves, your dosage  needs may change. You will need to have blood tests regularly so that your health care provider can watch your condition. SEEK MEDICAL CARE IF:  Your symptoms do not get better with treatment.  You are taking thyroid replacement medicine and:  You sweat excessively.  You have tremors.  You feel anxious.  You lose weight rapidly.  You cannot tolerate heat.  You have emotional swings.  You have diarrhea.  You feel weak. SEEK IMMEDIATE MEDICAL CARE IF:   You develop chest pain.  You develop an irregular heartbeat.  You develop a rapid heartbeat.   This information is not intended to replace advice given to you by your health care provider. Make sure you discuss any questions you have with your health care provider.   Document Released: 07/06/2005 Document Revised: 07/27/2014 Document Reviewed: 11/21/2013 Elsevier Interactive Patient Education [employer]

## 2015-05-31 LAB — TSH: TSH: 0.42 u[IU]/mL (ref 0.35–4.50)

## 2015-05-31 LAB — BASIC METABOLIC PANEL
BUN: 13 mg/dL (ref 6–23)
CHLORIDE: 103 meq/L (ref 96–112)
CO2: 26 mEq/L (ref 19–32)
Calcium: 9.8 mg/dL (ref 8.4–10.5)
Creatinine, Ser: 0.88 mg/dL (ref 0.40–1.20)
GFR: 84.63 mL/min (ref >60.00)
Glucose, Bld: 116 mg/dL — ABNORMAL HIGH (ref 70–99)
POTASSIUM: 3.8 meq/L (ref 3.5–5.1)
Sodium: 138 mEq/L (ref 135–145)

## 2015-05-31 LAB — LIPID PANEL
Cholesterol: 172 mg/dL (ref 0–200)
HDL: 72.2 mg/dL (ref >39.00)
LDL Cholesterol: 90 mg/dL (ref 0–99)
NonHDL: 99.48
Total CHOL/HDL Ratio: 2
Triglycerides: 49 mg/dL (ref 0.0–149.0)
VLDL: 9.8 mg/dL (ref 0.0–40.0)

## 2015-05-31 LAB — CBC WITH DIFFERENTIAL/PLATELET
Basophils Absolute: 0.3 10*3/uL — ABNORMAL HIGH (ref 0.0–0.1)
Basophils Relative: 2 % (ref 0.0–3.0)
EOS ABS: 0.1 10*3/uL (ref 0.0–0.7)
Eosinophils Relative: 0.4 % (ref 0.0–5.0)
HCT: 44.8 % (ref 36.0–46.0)
Hemoglobin: 15 g/dL (ref 12.0–15.0)
Lymphocytes Relative: 18.6 % (ref 12.0–46.0)
Lymphs Abs: 2.5 10*3/uL (ref 0.7–4.0)
MCHC: 33.5 g/dL (ref 30.0–36.0)
MCV: 88.6 fl (ref 78.0–100.0)
MONO ABS: 0.4 10*3/uL (ref 0.1–1.0)
Monocytes Relative: 3.1 % (ref 3.0–12.0)
NEUTROS ABS: 10.3 10*3/uL — AB (ref 1.4–7.7)
NEUTROS PCT: 75.9 % (ref 43.0–77.0)
Platelets: 391 10*3/uL (ref 150.0–400.0)
RBC: 5.06 Mil/uL (ref 3.87–5.11)
RDW: 12.4 % (ref 11.5–15.5)
WBC: 13.6 10*3/uL — ABNORMAL HIGH (ref 4.0–10.5)

## 2015-05-31 LAB — T4, FREE: Free T4: 1.2 ng/dL (ref 0.60–1.60)

## 2015-05-31 LAB — BRAIN NATRIURETIC PEPTIDE: PRO B NATRI PEPTIDE: 30 pg/mL (ref 0.0–100.0)

## 2015-05-31 LAB — T3, FREE: T3, Free: 3.3 pg/mL (ref 2.3–4.2)

## 2016-05-29 ENCOUNTER — Ambulatory Visit: Payer: Managed Care, Other (non HMO) | Admitting: Endocrinology

## 2016-05-29 DIAGNOSIS — Z0289 Encounter for other administrative examinations: Secondary | ICD-10-CM

## 2018-02-28 ENCOUNTER — Other Ambulatory Visit: Payer: Self-pay | Admitting: Family Medicine

## 2018-02-28 ENCOUNTER — Other Ambulatory Visit (HOSPITAL_COMMUNITY)
Admission: RE | Admit: 2018-02-28 | Discharge: 2018-02-28 | Disposition: A | Discharge status: Home / Self Care | Payer: Managed Care, Other (non HMO) | Source: Ambulatory Visit | Attending: Family Medicine | Admitting: Family Medicine

## 2018-02-28 DIAGNOSIS — Z124 Encounter for screening for malignant neoplasm of cervix: Secondary | ICD-10-CM | POA: Insufficient documentation

## 2018-03-03 ENCOUNTER — Other Ambulatory Visit: Payer: Self-pay | Admitting: Family Medicine

## 2018-03-03 DIAGNOSIS — N6320 Unspecified lump in the left breast, unspecified quadrant: Secondary | ICD-10-CM

## 2018-03-03 LAB — CYTOLOGY - PAP
ADEQUACY: ABSENT
CHLAMYDIA, DNA PROBE: NEGATIVE
Diagnosis: NEGATIVE
HPV: NOT DETECTED
NEISSERIA GONORRHEA: NEGATIVE

## 2018-04-18 ENCOUNTER — Ambulatory Visit
Admission: RE | Admit: 2018-04-18 | Discharge: 2018-04-18 | Disposition: A | Discharge status: Home / Self Care | Payer: Managed Care, Other (non HMO) | Source: Ambulatory Visit | Attending: Family Medicine | Admitting: Family Medicine

## 2018-04-18 DIAGNOSIS — N6320 Unspecified lump in the left breast, unspecified quadrant: Secondary | ICD-10-CM

## 2019-06-12 ENCOUNTER — Emergency Department (HOSPITAL_COMMUNITY)
Admission: EM | Admit: 2019-06-12 | Discharge: 2019-06-12 | Disposition: A | Discharge status: Home / Self Care | Payer: No Typology Code available for payment source | Attending: Emergency Medicine | Admitting: Emergency Medicine

## 2019-06-12 ENCOUNTER — Other Ambulatory Visit: Payer: Self-pay

## 2019-06-12 ENCOUNTER — Encounter (HOSPITAL_COMMUNITY): Payer: Self-pay | Admitting: Emergency Medicine

## 2019-06-12 DIAGNOSIS — Y999 Unspecified external cause status: Secondary | ICD-10-CM | POA: Insufficient documentation

## 2019-06-12 DIAGNOSIS — Y9389 Activity, other specified: Secondary | ICD-10-CM | POA: Insufficient documentation

## 2019-06-12 DIAGNOSIS — T7421XA Adult sexual abuse, confirmed, initial encounter: Secondary | ICD-10-CM | POA: Diagnosis not present

## 2019-06-12 DIAGNOSIS — R102 Pelvic and perineal pain: Secondary | ICD-10-CM | POA: Diagnosis not present

## 2019-06-12 DIAGNOSIS — Z79899 Other long term (current) drug therapy: Secondary | ICD-10-CM | POA: Diagnosis not present

## 2019-06-12 DIAGNOSIS — E039 Hypothyroidism, unspecified: Secondary | ICD-10-CM | POA: Diagnosis not present

## 2019-06-12 DIAGNOSIS — S31805A Open bite of unspecified buttock, initial encounter: Secondary | ICD-10-CM | POA: Diagnosis not present

## 2019-06-12 DIAGNOSIS — W503XXA Accidental bite by another person, initial encounter: Secondary | ICD-10-CM

## 2019-06-12 DIAGNOSIS — Y9289 Other specified places as the place of occurrence of the external cause: Secondary | ICD-10-CM | POA: Diagnosis not present

## 2019-06-12 LAB — POC URINE PREG, ED: Preg Test, Ur: NEGATIVE

## 2019-06-12 MED ORDER — AMOXICILLIN-POT CLAVULANATE 875-125 MG PO TABS: 1.0000 | ORAL_TABLET | Freq: Two times a day (BID) | ORAL | 0 refills | Status: DC

## 2019-06-12 MED ORDER — ACETAMINOPHEN 325 MG PO TABS
650.0000 mg | ORAL_TABLET | Freq: Once | ORAL | Status: AC
  Administered 2019-06-12: 650 mg via ORAL
  Filled 2019-06-12: qty 2

## 2019-06-12 MED ORDER — PROMETHAZINE HCL 25 MG PO TABS
25.0000 mg | ORAL_TABLET | Freq: Four times a day (QID) | ORAL | Status: DC | PRN
  Administered 2019-06-12: 17:00:00 75 mg via ORAL

## 2019-06-12 MED ORDER — METRONIDAZOLE 500 MG PO TABS
2000.0000 mg | ORAL_TABLET | Freq: Once | ORAL | Status: AC
  Administered 2019-06-12: 2000 mg via ORAL

## 2019-06-12 MED ORDER — ULIPRISTAL ACETATE 30 MG PO TABS
30.0000 mg | ORAL_TABLET | Freq: Once | ORAL | Status: AC
  Administered 2019-06-12: 30 mg via ORAL

## 2019-06-12 MED ORDER — CEFTRIAXONE SODIUM 250 MG IJ SOLR
250.0000 mg | Freq: Once | INTRAMUSCULAR | Status: AC
  Administered 2019-06-12: 17:00:00 250 mg via INTRAMUSCULAR

## 2019-06-12 MED ORDER — AZITHROMYCIN 250 MG PO TABS
1000.0000 mg | ORAL_TABLET | Freq: Once | ORAL | Status: AC
  Administered 2019-06-12: 1000 mg via ORAL

## 2019-06-12 MED ORDER — LIDOCAINE HCL (PF) 1 % IJ SOLN
0.9000 mL | Freq: Once | INTRAMUSCULAR | Status: AC
  Administered 2019-06-12: 17:00:00 0.9 mL

## 2019-06-12 NOTE — ED Triage Notes (Signed)
Sane Nurse in room

## 2019-06-12 NOTE — SANE Note (Signed)
On 06/12/2019, at approximately 1745 hours, the SANE/FNE Naval architect) consult was completed. The primary RN and physician have been notified. Please contact the SANE/FNE nurse on call (listed in [place]) with any further concerns.

## 2019-06-12 NOTE — ED Notes (Signed)
PT DC by Sane Nurse.
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
## 2019-06-12 NOTE — ED Triage Notes (Signed)
Sane RN is coming to eval Pt.
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
## 2019-06-12 NOTE — Discharge Instructions (Signed)
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 after the expiration date. NOTE: This sheet is a summary. It may not cover all possible information. If you have questions about this medicine, talk to your doctor, pharmacist, or health care provider.  2020 Elsevier/Gold Standard (2018-06-28 06:52:33)  SENT HOME WITH PATIENT.  TAKE  PHENERGAN 30 MINUTES BEFORE TAKING ELLA TO DECREASE STOMACH UPSET/NAUSEA. Ulipristal oral tablets Michele Bullock) What is this medicine? ULIPRISTAL (UE li pris tal) is an emergency contraceptive. It prevents pregnancy if taken within 5 days (120 hours) after your birth control fails or you have unprotected sex. This medicine will not work if you are  already pregnant. COMMON BRAND NAME(S): ella What should I tell my health care provider before I take this medicine? They need to know if you have any of these conditions: -an unusual or allergic reaction to ulipristal, other medicines, foods, dyes, or preservatives -pregnant or trying to get pregnant -breast-feeding How should I use this medicine? Take this medicine by mouth with or without food. Your doctor may want you to use a quick-response pregnancy test prior to using the tablets. Take your medicine as soon as possible and not more than 5 days (120 hours) after the event. This medicine can be taken at any time during your menstrual cycle. Follow the dose instructions of your health care provider exactly. Contact your health care provider right away if you vomit within 3 hours of taking your medicine to discuss if you need to take another tablet. A patient package insert for the product will be given with each prescription and refill. Read this sheet carefully each time. The sheet may change frequently. Contact your pediatrician regarding the use of this medicine in children. Special care may be needed. What if I miss a dose? This does not apply; this medicine is not for regular use. What may interact with this medicine? This medicine may interact with the following medications: -birth control pills -bosentan -certain medicines for fungal infections like griseofulvin, itraconazole, and ketoconazole -certain medicines for seizures like barbiturates, carbamazepine, felbamate, oxcarbazepine, phenytoin, topiramate -dabigatran -digoxin -rifampin -St. John's Wort What should I watch for while using this medicine? Your period may begin a few days earlier or later than expected. If your period is more than 7 days late, pregnancy is possible. See your health care provider as soon as you can and get a pregnancy test. Talk to your healthcare provider before taking this medicine if you know or  suspect that you are pregnant. Contact your healthcare provider if you think you may be pregnant and you have taken this medicine. Your healthcare provider may wish to provide information on your pregnancy to help study the safety of this medicine during pregnancy. For information, go to [employer]Te[employer]it. If you have severe abdominal pain about 3 to 5 weeks after taking this medicine, you may have a pregnancy outside the womb, which is called an ectopic or tubal pregnancy. Call your health care provider or go to the nearest emergency room right away if you think this is happening. Discuss birth control options with your health care provider. Emergency birth control is not to be used routinely to prevent pregnancy. It should not be used more than once in  the same cycle. Birth control pills may not work properly while you are taking this medicine. Wait at least 5 days after taking this medicine to start or continue other hormone based birth control. Be sure to use a reliable barrier contraceptive method (such as a condom with spermicide) between the time you take this medicine and your next period. This medicine does not protect you against HIV infection (AIDS) or any other sexually transmitted diseases (STDs). What side effects may I notice from receiving this medicine? Side effects that you should report to your doctor or health care professional as soon as possible: -allergic reactions like skin rash, itching or hives, swelling of the face, lips, or tongue Side effects that usually do not require medical attention (report to your doctor or health care professional if they continue or are bothersome): -dizziness -headache -nausea -spotting -stomach pain -tiredness Where should I keep my medicine? Keep out of the reach of children. Store at between 20 and 25 degrees C (68 and 77 degrees F). Protect from light and keep in the blister card inside the original box until you are ready to take it. Throw  away any unused medicine after the expiration date.  2017 Elsevier/Gold Standard (2015-08-08 10:39:30)    Promethazine (pack of 3 for home use)-SENT THREE (25MG) TABLETS HOME WITH PATIENT.  TAKE 1 TABLET 30 MINUTES BEFORE TAKING ELLA (EMERGENCY CONTRACEPTION).  CAN TAKE  1 TABLET EVERY 6-8 HOURS, IF NEEDED FOR NAUSEA/SLEEP. Also known as:  Phenergan  Promethazine tablets  What is this medicine? PROMETHAZINE (proe METH a zeen) is an antihistamine. It is used to treat allergic reactions and to treat or prevent nausea and vomiting from illness or motion sickness. It is also used to make you sleep before surgery, and to help treat pain or nausea after surgery. This medicine may be used for other purposes; ask your health care provider or pharmacist if you have questions. COMMON BRAND NAME(S): Phenergan What should I tell my health care provider before I take this medicine? They need to know if you have any of these conditions:  glaucoma  high blood pressure or heart disease  kidney disease  liver disease  lung or breathing disease, like asthma  prostate trouble  pain or difficulty passing urine  seizures  an unusual or allergic reaction to promethazine or phenothiazines, other medicines, foods, dyes, or preservatives  pregnant or trying to get pregnant  breast-feeding How should I use this medicine? Take this medicine by mouth with a glass of water. Follow the directions on the prescription label. Take your doses at regular intervals. Do not take your medicine more often than directed. Talk to your pediatrician regarding the use of this medicine in children. Special care may be needed. This medicine should not be given to infants and children younger than 71 years old. Overdosage: If you think you have taken too much of this medicine contact a poison control center or emergency room at once. NOTE: This medicine is only for you. Do not share this medicine with others. What if I  miss a dose? If you miss a dose, take it as soon as you can. If it is almost time for your next dose, take only that dose. Do not take double or extra doses. What may interact with this medicine? Do not take this medicine with any of the following medications:  cisapride  dronedarone  MAOIs like Carbex, Eldepryl, Marplan, Nardil, Parnate  pimozide  quinidine, including dextromethorphan; quinidine  thioridazine This  medicine may also interact with the following medications:  certain medicines for depression, anxiety, or psychotic disturbances  certain medicines for anxiety or sleep  certain medicines for seizures like carbamazepine, phenobarbital, phenytoin  certain medicines for movement abnormalities as in Parkinson's disease, or for gastrointestinal problems  epinephrine  medicines for allergies or colds  muscle relaxants  narcotic medicines for pain  other medicines that prolong the QT interval (cause an abnormal heart rhythm) like dofetilide, ziprasidone  tramadol  trimethobenzamide This list may not describe all possible interactions. Give your health care provider a list of all the medicines, herbs, non-prescription drugs, or dietary supplements you use. Also tell them if you smoke, drink alcohol, or use illegal drugs. Some items may interact with your medicine. What should I watch for while using this medicine? Tell your doctor or health care professional if your symptoms do not start to get better in 1 to 2 days. You may get drowsy or dizzy. Do not drive, use machinery, or do anything that needs mental alertness until you know how this medicine affects you. To reduce the risk of dizzy or fainting spells, do not stand or sit up quickly, especially if you are an older patient. Alcohol may increase dizziness and drowsiness. Avoid alcoholic drinks. Your mouth may get dry. Chewing sugarless gum or sucking hard candy, and drinking plenty of water may help. Contact your  doctor if the problem does not go away or is severe. This medicine may cause dry eyes and blurred vision. If you wear contact lenses you may feel some discomfort. Lubricating drops may help. See your eye doctor if the problem does not go away or is severe. This medicine can make you more sensitive to the sun. Keep out of the sun. If you cannot avoid being in the sun, wear protective clothing and use sunscreen. Do not use sun lamps or tanning beds/booths. If you are diabetic, check your blood-sugar levels regularly. What side effects may I notice from receiving this medicine? Side effects that you should report to your doctor or health care professional as soon as possible:  blurred vision  irregular heartbeat, palpitations or chest pain  muscle or facial twitches  pain or difficulty passing urine  seizures  skin rash  slowed or shallow breathing  unusual bleeding or bruising  yellowing of the eyes or skin Side effects that usually do not require medical attention (report to your doctor or health care professional if they continue or are bothersome):  headache  nightmares, agitation, nervousness, excitability, not able to sleep (these are more likely in children)  stuffy nose This list may not describe all possible side effects. Call your doctor for medical advice about side effects. You may report side effects to FDA at 1-800-FDA-1088. Where should I keep my medicine? Keep out of the reach of children. Store at room temperature, between 20 and 25 degrees C (68 and 77 degrees F). Protect from light. Throw away any unused medicine after the expiration date. NOTE: This sheet is a summary. It may not cover all possible information. If you have questions about this medicine, talk to your doctor, pharmacist, or health care provider.  2020 Elsevier/Gold Standard (2018-06-28 08:46:17)

## 2019-06-12 NOTE — ED Triage Notes (Signed)
PT seen and treated by [facility] nurse and PA.
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
## 2019-06-12 NOTE — ED Triage Notes (Signed)
PT requested  Her Sister to come to room to be with her when provider sees her. Security  Notified sister may come back to room.

## 2019-06-14 ENCOUNTER — Other Ambulatory Visit: Payer: Self-pay

## 2019-06-14 ENCOUNTER — Emergency Department (HOSPITAL_COMMUNITY)
Admission: EM | Admit: 2019-06-14 | Discharge: 2019-06-14 | Disposition: A | Discharge status: Home / Self Care | Payer: No Typology Code available for payment source | Attending: Emergency Medicine | Admitting: Emergency Medicine

## 2019-06-14 ENCOUNTER — Encounter (HOSPITAL_COMMUNITY): Payer: Self-pay | Admitting: Emergency Medicine

## 2019-06-14 ENCOUNTER — Ambulatory Visit (HOSPITAL_COMMUNITY)
Admission: EM | Admit: 2019-06-14 | Discharge: 2019-06-14 | Disposition: A | Discharge status: Home / Self Care | Payer: No Typology Code available for payment source | Source: Ambulatory Visit | Attending: Emergency Medicine | Admitting: Emergency Medicine

## 2019-06-14 DIAGNOSIS — E039 Hypothyroidism, unspecified: Secondary | ICD-10-CM | POA: Insufficient documentation

## 2019-06-14 DIAGNOSIS — Z0441 Encounter for examination and observation following alleged adult rape: Secondary | ICD-10-CM | POA: Insufficient documentation

## 2019-06-14 DIAGNOSIS — T7421XD Adult sexual abuse, confirmed, subsequent encounter: Secondary | ICD-10-CM

## 2019-06-14 DIAGNOSIS — Z79899 Other long term (current) drug therapy: Secondary | ICD-10-CM | POA: Insufficient documentation

## 2019-06-14 LAB — HM HIV SCREENING LAB: HM HIV Screening: NEGATIVE

## 2019-06-14 LAB — HM HEPATITIS C SCREENING LAB: HM Hepatitis Screen: NEGATIVE
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
## 2019-06-14 NOTE — Discharge Instructions (Signed)
Follow instructions for follow-up as provided to you by the SANE nurse on your paperwork from your previous visit
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
## 2019-06-14 NOTE — ED Notes (Signed)
ED Provider at bedside. 

## 2019-06-14 NOTE — ED Notes (Signed)
SANE RN at bedside.

## 2019-06-19 ENCOUNTER — Encounter: Payer: Self-pay | Admitting: *Deleted

## 2019-06-26 ENCOUNTER — Encounter: Payer: Self-pay | Admitting: Family Medicine

## 2019-06-26 ENCOUNTER — Other Ambulatory Visit: Payer: Self-pay

## 2019-06-26 ENCOUNTER — Ambulatory Visit: Payer: Self-pay | Admitting: Clinical

## 2019-06-26 ENCOUNTER — Ambulatory Visit (INDEPENDENT_AMBULATORY_CARE_PROVIDER_SITE_OTHER): Payer: Self-pay | Admitting: Family Medicine

## 2019-06-26 VITALS — BP 105/62 | Wt 174.1 lb

## 2019-06-26 DIAGNOSIS — Z91199 Patient's noncompliance with other medical treatment and regimen due to unspecified reason: Secondary | ICD-10-CM

## 2019-06-26 DIAGNOSIS — Z5329 Procedure and treatment not carried out because of patient's decision for other reasons: Secondary | ICD-10-CM

## 2019-06-26 DIAGNOSIS — T7421XA Adult sexual abuse, confirmed, initial encounter: Secondary | ICD-10-CM

## 2019-06-26 NOTE — BH Specialist Note (Addendum)
Pt did not arrive to video visit and did not answer the phone ; Left HIPPA-compliant message to call back Roselyn Reef from [facility] [employer] at [phone].   Visit not needed today, as pt has a therapist, and was seeing therapist this morning.    [place] via Telemedicine Video Visit  06/26/2019 SERENITIE VINTON [phone]  G[place]an Fair
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
## 2022-01-23 ENCOUNTER — Ambulatory Visit (INDEPENDENT_AMBULATORY_CARE_PROVIDER_SITE_OTHER): Payer: 59 | Admitting: Nurse Practitioner

## 2022-01-23 ENCOUNTER — Encounter: Payer: Self-pay | Admitting: Nurse Practitioner

## 2022-01-23 VITALS — BP 130/90 | HR 98 | Temp 98.2°F | Ht 64.0 in | Wt 181.8 lb

## 2022-01-23 DIAGNOSIS — G44209 Tension-type headache, unspecified, not intractable: Secondary | ICD-10-CM | POA: Diagnosis not present

## 2022-01-23 DIAGNOSIS — F419 Anxiety disorder, unspecified: Secondary | ICD-10-CM

## 2022-01-23 DIAGNOSIS — F32A Depression, unspecified: Secondary | ICD-10-CM | POA: Diagnosis not present

## 2022-01-23 DIAGNOSIS — Z23 Encounter for immunization: Secondary | ICD-10-CM | POA: Diagnosis not present

## 2022-01-23 DIAGNOSIS — Z13220 Encounter for screening for lipoid disorders: Secondary | ICD-10-CM

## 2022-01-23 DIAGNOSIS — E039 Hypothyroidism, unspecified: Secondary | ICD-10-CM

## 2022-01-23 MED ORDER — LEVOTHYROXINE SODIUM 50 MCG PO TABS: 50.0000 ug | ORAL_TABLET | Freq: Every day | ORAL | 1 refills | Status: DC

## 2022-01-23 MED ORDER — LORAZEPAM 1 MG PO TABS: 1.0000 mg | ORAL_TABLET | Freq: Once | ORAL | 0 refills | Status: AC

## 2022-01-23 NOTE — Assessment & Plan Note (Signed)
Chronic, stable.  Continue Botox injections and massages to help control tension headaches.  Follow-up with any concerns.

## 2022-01-23 NOTE — Patient Instructions (Addendum)
It was great to see you!  We are checking your labs today and will let you know the results via mychart/phone.   I have refilled your thyroid medicine.   I am going to send in 1 ativan pill for you to take 1 hour before your physical.   Let's follow-up in 4 weeks, sooner if you have concerns.  If a referral was placed today, you will be contacted for an appointment. Please note that routine referrals can sometimes take up to 3-4 weeks to process. Please call our office if you haven't heard anything after this time frame.  Take care,  Rodman Pickle, NP
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
## 2022-01-23 NOTE — Assessment & Plan Note (Signed)
She is currently taking levothyroxine 50 mcg daily.  She states that her thyroid levels have been stable since starting the medication several years ago.  She has been out of her medication for the past month although she received some levothyroxine and [place] when she was visiting.  We will check TSH and free T4 today.

## 2022-01-24 LAB — CBC WITH DIFFERENTIAL/PLATELET
Absolute Monocytes: 536 cells/uL (ref 200–950)
Basophils Absolute: 43 cells/uL (ref 0–200)
Basophils Relative: 0.5 %
Eosinophils Absolute: 77 cells/uL (ref 15–500)
Eosinophils Relative: 0.9 %
HCT: 43.1 % (ref 35.0–45.0)
Hemoglobin: 14.6 g/dL (ref 11.7–15.5)
Lymphs Abs: 3052 cells/uL (ref 850–3900)
MCH: 30.5 pg (ref 27.0–33.0)
MCHC: 33.9 g/dL (ref 32.0–36.0)
MCV: 90 fL (ref 80.0–100.0)
MPV: 10.5 fL (ref 7.5–12.5)
Monocytes Relative: 6.3 %
Neutro Abs: 4794 cells/uL (ref 1500–7800)
Neutrophils Relative %: 56.4 %
Platelets: 322 10*3/uL (ref 140–400)
RBC: 4.79 10*6/uL (ref 3.80–5.10)
RDW: 11.9 % (ref 11.0–15.0)
Total Lymphocyte: 35.9 %
WBC: 8.5 10*3/uL (ref 3.8–10.8)

## 2022-01-24 LAB — COMPREHENSIVE METABOLIC PANEL
AG Ratio: 2.1 (calc) (ref 1.0–2.5)
ALT: 12 U/L (ref 6–29)
AST: 14 U/L (ref 10–30)
Albumin: 4.9 g/dL (ref 3.6–5.1)
Alkaline phosphatase (APISO): 48 U/L (ref 31–125)
BUN: 14 mg/dL (ref 7–25)
CO2: 24 mmol/L (ref 20–32)
Calcium: 10.1 mg/dL (ref 8.6–10.2)
Chloride: 104 mmol/L (ref 98–110)
Creat: 0.82 mg/dL (ref 0.50–0.96)
Globulin: 2.3 g/dL (calc) (ref 1.9–3.7)
Glucose, Bld: 86 mg/dL (ref 65–99)
Potassium: 4.1 mmol/L (ref 3.5–5.3)
Sodium: 138 mmol/L (ref 135–146)
Total Bilirubin: 0.7 mg/dL (ref 0.2–1.2)
Total Protein: 7.2 g/dL (ref 6.1–8.1)

## 2022-01-24 LAB — LIPID PANEL
Cholesterol: 200 mg/dL — ABNORMAL HIGH (ref <200)
HDL: 63 mg/dL (ref >=50)
LDL Cholesterol (Calc): 120 mg/dL (calc) — ABNORMAL HIGH
Non-HDL Cholesterol (Calc): 137 mg/dL (calc) — ABNORMAL HIGH (ref <130)
Total CHOL/HDL Ratio: 3.2 (calc) (ref <5.0)
Triglycerides: 73 mg/dL (ref <150)

## 2022-01-24 LAB — TSH: TSH: 2.17 mIU/L

## 2022-01-24 LAB — T4, FREE: Free T4: 1.3 ng/dL (ref 0.8–1.8)

## 2022-03-06 ENCOUNTER — Other Ambulatory Visit (HOSPITAL_COMMUNITY)
Admission: RE | Admit: 2022-03-06 | Discharge: 2022-03-06 | Disposition: A | Discharge status: Home / Self Care | Payer: 59 | Source: Ambulatory Visit | Attending: Nurse Practitioner | Admitting: Nurse Practitioner

## 2022-03-06 ENCOUNTER — Ambulatory Visit (INDEPENDENT_AMBULATORY_CARE_PROVIDER_SITE_OTHER): Payer: 59 | Admitting: Nurse Practitioner

## 2022-03-06 ENCOUNTER — Encounter: Payer: Self-pay | Admitting: Nurse Practitioner

## 2022-03-06 VITALS — BP 112/70 | HR 98 | Temp 98.2°F | Wt 184.0 lb

## 2022-03-06 DIAGNOSIS — F32A Depression, unspecified: Secondary | ICD-10-CM

## 2022-03-06 DIAGNOSIS — Z0000 Encounter for general adult medical examination without abnormal findings: Secondary | ICD-10-CM | POA: Diagnosis not present

## 2022-03-06 DIAGNOSIS — Z124 Encounter for screening for malignant neoplasm of cervix: Secondary | ICD-10-CM | POA: Diagnosis present

## 2022-03-06 DIAGNOSIS — E039 Hypothyroidism, unspecified: Secondary | ICD-10-CM | POA: Diagnosis not present

## 2022-03-06 DIAGNOSIS — F419 Anxiety disorder, unspecified: Secondary | ICD-10-CM

## 2022-03-06 DIAGNOSIS — R21 Rash and other nonspecific skin eruption: Secondary | ICD-10-CM

## 2022-03-06 MED ORDER — TRIAMCINOLONE ACETONIDE 0.1 % EX CREA: 1.0000 | TOPICAL_CREAM | Freq: Two times a day (BID) | CUTANEOUS | 1 refills | Status: DC

## 2022-03-06 NOTE — Progress Notes (Addendum)
Temp 98.2 F (36.8 C) (Temporal)   Wt 184 lb (83.5 kg)   BMI 31.58 kg/m    Subjective:    Patient ID: Michele Bullock, female    DOB: 1991/07/18, 31 y.o.   MRN: [id number]  CC: No chief complaint on file.  HPI: Michele Bullock is a 31 y.o. female presenting on 03/06/2022 for comprehensive medical examination. Current medical complaints include:none  She currently lives with: boyfriend Menopausal Symptoms: no  Depression Screen done today and results listed below:     01/23/2022    4:08 PM 06/26/2019    3:44 PM  Depression screen PHQ 2/9  Decreased Interest 0 2  Down, Depressed, Hopeless 0 2  PHQ - 2 Score 0 4  Altered sleeping 0 2  Tired, decreased energy 0 3  Change in appetite 0 3  Feeling bad or failure about yourself  0 2  Trouble concentrating  3  Moving slowly or fidgety/restless 0 3  Suicidal thoughts 0 1  PHQ-9 Score 0 21  Difficult doing work/chores Somewhat difficult     The patient does not have a history of falls. I did not complete a risk assessment for falls. A plan of care for falls was not documented.   Past Medical History:  Past Medical History:  Diagnosis Date   Allergy Idk last couple years   Anxiety    Depression 2011   Before diagnosing my hypothyroidism, not since ors regulated   GERD (gastroesophageal reflux disease)    More recently and since I have quit drinking oddly enough.   Hypothyroidism    Miscarriage 07/20/2009   Thyroid disease     Surgical History:  Past Surgical History:  Procedure Laterality Date   DILATION AND EVACUATION N/A 11/03/2013   Procedure: DILATATION AND EVACUATION with genetic studies;  Surgeon: Mitchel Honour, DO;  Location: [facility];  Service: Gynecology;  Laterality: N/A;  with genetic studies   TONSILLECTOMY      Medications:  Current Outpatient Medications on File Prior to Visit  Medication Sig   albuterol (VENTOLIN HFA) 108 (90 Base) MCG/ACT inhaler Inhale 1 puff into the lungs every 4 (four) hours as needed  for shortness of breath.   cetirizine (ZYRTEC) 10 MG chewable tablet Chew 10 mg by mouth daily.   levothyroxine (SYNTHROID) 50 MCG tablet Take 1 tablet (50 mcg total) by mouth daily.   LORazepam (ATIVAN) 1 MG tablet Take 1 mg by mouth daily as needed.   No current facility-administered medications on file prior to visit.    Allergies:  Allergies  Allergen Reactions   Valtrex [Valacyclovir Hcl] Anaphylaxis    Social History:  Social History   Socioeconomic History   Marital status: Single    Spouse name: Not on file   Number of children: Not on file   Years of education: Not on file   Highest education level: Not on file  Occupational History   Not on file  Tobacco Use   Smoking status: Former    Packs/day: 0.00    Years: 5.00    Total pack years: 0.00    Types: Cigarettes, E-cigarettes    Quit date: 01/30/2017    Years since quitting: 5.0   Smokeless tobacco: Never   Tobacco comments:    Smoked pretty heavy from [age] then vapes heavily to quit. Now pouches to taper off all nicotine.  Substance and Sexual Activity   Alcohol use: Yes    Comment: socially   Drug use: Yes  Types: Marijuana   Sexual activity: Yes    Birth control/protection: I.U.D.  Other Topics Concern   Not on file  Social History Narrative   Not on file   Social Determinants of Health   Financial Resource Strain: Not on file  Food Insecurity: Not on file  Transportation Needs: Not on file  Physical Activity: Not on file  Stress: Not on file  Social Connections: Not on file  Intimate Partner Violence: Not on file   Social History   Tobacco Use  Smoking Status Former   Packs/day: 0.00   Years: 5.00   Total pack years: 0.00   Types: Cigarettes, E-cigarettes   Quit date: 01/30/2017   Years since quitting: 5.0  Smokeless Tobacco Never  Tobacco Comments   Smoked pretty heavy from 20-25 then vapes heavily to quit. Now pouches to taper off all nicotine.   Social History   Substance and  Sexual Activity  Alcohol Use Yes   Comment: socially    Family History:  Family History  Problem Relation Age of Onset   Hypothyroidism Maternal Aunt    ADD / ADHD Mother    Depression Mother    Hypertension Mother    Diabetes Maternal Grandfather    Heart disease Maternal Grandfather    Hypertension Maternal Grandfather    Obesity Maternal Grandfather    Varicose Veins Maternal Grandmother    COPD Paternal Grandmother    ADD / ADHD Sister    Depression Sister    Depression Maternal Aunt     Past medical history, surgical history, medications, allergies, family history and social history reviewed with patient today and changes made to appropriate areas of the chart.   ROS All other ROS negative except what is listed above and in the HPI.      Objective:    Temp 98.2 F (36.8 C) (Temporal)   Wt 184 lb (83.5 kg)   BMI 31.58 kg/m   Wt Readings from Last 3 Encounters:  03/06/22 184 lb (83.5 kg)  01/23/22 181 lb 12.8 oz (82.5 kg)  06/26/19 174 lb 1.6 oz (79 kg)    Physical Exam  Results for orders placed or performed in visit on 01/23/22  CBC with Differential/Platelet  Result Value Ref Range   WBC 8.5 3.8 - 10.8 Thousand/uL   RBC 4.79 3.80 - 5.10 Million/uL   Hemoglobin 14.6 11.7 - 15.5 g/dL   HCT 46.2 70.3 - 50.0 %   MCV 90.0 80.0 - 100.0 fL   MCH 30.5 27.0 - 33.0 pg   MCHC 33.9 32.0 - 36.0 g/dL   RDW 93.8 18.2 - 99.3 %   Platelets 322 140 - 400 Thousand/uL   MPV 10.5 7.5 - 12.5 fL   Neutro Abs 4,794 1,500 - 7,800 cells/uL   Lymphs Abs 3,052 850 - 3,900 cells/uL   Absolute Monocytes 536 200 - 950 cells/uL   Eosinophils Absolute 77 15 - 500 cells/uL   Basophils Absolute 43 0 - 200 cells/uL   Neutrophils Relative % 56.4 %   Total Lymphocyte 35.9 %   Monocytes Relative 6.3 %   Eosinophils Relative 0.9 %   Basophils Relative 0.5 %  Comprehensive metabolic panel  Result Value Ref Range   Glucose, Bld 86 65 - 99 mg/dL   BUN 14 7 - 25 mg/dL   Creat 7.16  9.67 - 8.93 mg/dL   BUN/Creatinine Ratio NOT APPLICABLE 6 - 22 (calc)   Sodium 138 135 - 146 mmol/L   Potassium 4.1  3.5 - 5.3 mmol/L   Chloride 104 98 - 110 mmol/L   CO2 24 20 - 32 mmol/L   Calcium 10.1 8.6 - 10.2 mg/dL   Total Protein 7.2 6.1 - 8.1 g/dL   Albumin 4.9 3.6 - 5.1 g/dL   Globulin 2.3 1.9 - 3.7 g/dL (calc)   AG Ratio 2.1 1.0 - 2.5 (calc)   Total Bilirubin 0.7 0.2 - 1.2 mg/dL   Alkaline phosphatase (APISO) 48 31 - 125 U/L   AST 14 10 - 30 U/L   ALT 12 6 - 29 U/L  Lipid panel  Result Value Ref Range   Cholesterol 200 (H) <200 mg/dL   HDL 63 > OR = 50 mg/dL   Triglycerides 73 <413 mg/dL   LDL Cholesterol (Calc) 120 (H) mg/dL (calc)   Total CHOL/HDL Ratio 3.2 <5.0 (calc)   Non-HDL Cholesterol (Calc) 137 (H) <130 mg/dL (calc)  T4, free  Result Value Ref Range   Free T4 1.3 0.8 - 1.8 ng/dL  TSH  Result Value Ref Range   TSH 2.17 mIU/L      Assessment & Plan:   Problem List Items Addressed This Visit   None    Follow up plan: No follow-ups on file.   LABORATORY TESTING:  - Pap smear: pap done  IMMUNIZATIONS:   - Tdap: Tetanus vaccination status reviewed: last tetanus booster within 10 years. - Influenza: Refused - Pneumovax: Not applicable - Prevnar: Not applicable - HPV: Not applicable - Zostavax vaccine: Not applicable  SCREENING: -Mammogram: Not applicable  - Colonoscopy: Not applicable  - Bone Density: Not applicable  -Hearing Test: Not applicable  -Spirometry: Not applicable   PATIENT COUNSELING:   Advised to take 1 mg of folate supplement per day if capable of pregnancy.   Sexuality: Discussed sexually transmitted diseases, partner selection, use of condoms, avoidance of unintended pregnancy  and contraceptive alternatives.   Advised to avoid cigarette smoking.  I discussed with the patient that most people either abstain from alcohol or drink within safe limits (<=14/week and <=4 drinks/occasion for males, <=7/weeks and <= 3  drinks/occasion for females) and that the risk for alcohol disorders and other health effects rises proportionally with the number of drinks per week and how often a drinker exceeds daily limits.  Discussed cessation/primary prevention of drug use and availability of treatment for abuse.   Diet: Encouraged to adjust caloric intake to maintain  or achieve ideal body weight, to reduce intake of dietary saturated fat and total fat, to limit sodium intake by avoiding high sodium foods and not adding table salt, and to maintain adequate dietary potassium and calcium preferably from fresh fruits, vegetables, and low-fat dairy products.    stressed the importance of regular exercise  Injury prevention: Discussed safety belts, safety helmets, smoke detector, smoking near bedding or upholstery.   Dental health: Discussed importance of regular tooth brushing, flossing, and dental visits.    NEXT PREVENTATIVE PHYSICAL DUE IN 1 YEAR. No follow-ups on file.

## 2022-03-06 NOTE — Patient Instructions (Signed)
It was great to see you!  Keep up the great work!   Let's follow-up in 1 year, sooner if you have concerns.  If a referral was placed today, you will be contacted for an appointment. Please note that routine referrals can sometimes take up to 3-4 weeks to process. Please call our office if you haven't heard anything after this time frame.  Take care,  Dewey Viens, NP  

## 2022-03-07 NOTE — Assessment & Plan Note (Signed)
Chronic, stable. Continue going to therapy as needed.

## 2022-03-07 NOTE — Assessment & Plan Note (Signed)
Chronic, stable. Continue levothyroxine 50mcg daily.  ?

## 2022-03-11 LAB — CYTOLOGY - PAP
Adequacy: ABSENT
Comment: NEGATIVE
Diagnosis: NEGATIVE
High risk HPV: NEGATIVE

## 2022-04-23 ENCOUNTER — Encounter: Payer: Self-pay | Admitting: Nurse Practitioner

## 2022-07-20 ENCOUNTER — Other Ambulatory Visit: Payer: Self-pay | Admitting: Nurse Practitioner

## 2022-07-27 NOTE — Telephone Encounter (Signed)
Pt called to f/u on levothyroxine. She has been out x 1 week. Does she need appt/labs?  [facility] on [address]

## 2022-07-28 ENCOUNTER — Encounter: Payer: Self-pay | Admitting: Nurse Practitioner

## 2022-09-27 DIAGNOSIS — J029 Acute pharyngitis, unspecified: Secondary | ICD-10-CM | POA: Diagnosis not present

## 2022-09-27 DIAGNOSIS — Z6829 Body mass index (BMI) 29.0-29.9, adult: Secondary | ICD-10-CM | POA: Diagnosis not present

## 2022-09-27 DIAGNOSIS — J309 Allergic rhinitis, unspecified: Secondary | ICD-10-CM | POA: Diagnosis not present

## 2022-09-27 DIAGNOSIS — J018 Other acute sinusitis: Secondary | ICD-10-CM | POA: Diagnosis not present

## 2023-01-05 DIAGNOSIS — Z681 Body mass index (BMI) 19 or less, adult: Secondary | ICD-10-CM | POA: Diagnosis not present

## 2023-01-05 DIAGNOSIS — U071 COVID-19: Secondary | ICD-10-CM | POA: Diagnosis not present

## 2023-01-05 DIAGNOSIS — J069 Acute upper respiratory infection, unspecified: Secondary | ICD-10-CM | POA: Diagnosis not present

## 2023-01-05 DIAGNOSIS — R509 Fever, unspecified: Secondary | ICD-10-CM | POA: Diagnosis not present

## 2023-02-01 ENCOUNTER — Ambulatory Visit: Payer: 59 | Admitting: Nurse Practitioner

## 2023-02-01 ENCOUNTER — Telehealth: Payer: 59 | Admitting: Family Medicine

## 2023-02-01 VITALS — BP 106/60 | HR 86 | Temp 98.4°F | Ht 64.0 in | Wt 197.8 lb

## 2023-02-01 DIAGNOSIS — E039 Hypothyroidism, unspecified: Secondary | ICD-10-CM

## 2023-02-01 DIAGNOSIS — E7800 Pure hypercholesterolemia, unspecified: Secondary | ICD-10-CM | POA: Diagnosis not present

## 2023-02-01 DIAGNOSIS — J3089 Other allergic rhinitis: Secondary | ICD-10-CM | POA: Insufficient documentation

## 2023-02-01 LAB — CBC WITH DIFFERENTIAL/PLATELET
Basophils Absolute: 0.1 10*3/uL (ref 0.0–0.1)
Basophils Relative: 1.1 % (ref 0.0–3.0)
Eosinophils Absolute: 0.2 10*3/uL (ref 0.0–0.7)
Eosinophils Relative: 2.3 % (ref 0.0–5.0)
HCT: 42 % (ref 36.0–46.0)
Hemoglobin: 14 g/dL (ref 12.0–15.0)
Lymphocytes Relative: 43.3 % (ref 12.0–46.0)
Lymphs Abs: 3.4 10*3/uL (ref 0.7–4.0)
MCHC: 33.4 g/dL (ref 30.0–36.0)
MCV: 89.7 fl (ref 78.0–100.0)
Monocytes Absolute: 0.6 10*3/uL (ref 0.1–1.0)
Monocytes Relative: 7.5 % (ref 3.0–12.0)
Neutro Abs: 3.6 10*3/uL (ref 1.4–7.7)
Neutrophils Relative %: 45.8 % (ref 43.0–77.0)
Platelets: 351 10*3/uL (ref 150.0–400.0)
RBC: 4.68 Mil/uL (ref 3.87–5.11)
RDW: 12.3 % (ref 11.5–15.5)
WBC: 7.9 10*3/uL (ref 4.0–10.5)

## 2023-02-01 LAB — TSH: TSH: 1.65 u[IU]/mL (ref 0.35–5.50)

## 2023-02-01 MED ORDER — TRIAMCINOLONE ACETONIDE 0.1 % EX CREA: 1.0000 | TOPICAL_CREAM | Freq: Two times a day (BID) | CUTANEOUS | 1 refills | Status: AC

## 2023-02-01 MED ORDER — MONTELUKAST SODIUM 10 MG PO TABS: 10.0000 mg | ORAL_TABLET | Freq: Every day | ORAL | 3 refills | Status: DC

## 2023-02-01 MED ORDER — LEVOTHYROXINE SODIUM 50 MCG PO TABS: 50.0000 ug | ORAL_TABLET | Freq: Every day | ORAL | 3 refills | Status: DC

## 2023-02-01 NOTE — Assessment & Plan Note (Signed)
Chronic, stable.  Continue levothyroxine 50 mcg daily.  Check TSH today and adjust regimen based on results.

## 2023-02-01 NOTE — Assessment & Plan Note (Signed)
Chronic, ongoing.  Will refill her montelukast 10 mg daily.  She can also continue to take Zyrtec 10 mg daily as needed.

## 2023-02-01 NOTE — Patient Instructions (Signed)
It was great to see you!  We are checking your labs today and will let you know the results via mychart/phone.   I have refilled your medications.   Let's follow-up next month for your physical   Take care,  Rodman Pickle, NP

## 2023-02-01 NOTE — Progress Notes (Signed)
Established Patient Office Visit  Subjective   Patient ID: Michele Bullock, female    DOB: 25-Mar-1991  Age: 32 y.o. MRN: [id number]  Chief Complaint  Patient presents with   Medication Management    Rx refill and lab work for Levothyroxine refill    HPI  Michele Bullock is here to follow-up on hypothyroidism and also allergies.   She states that she has been having congestion and left ear pain. She states that it tends to come and go. She was given montelukast in the past which helped with her symptoms. She does have post nasal drip and a slight sore throat. She denies fevers and coughing.   She has been taking her levothyroxine daily. She is not having any side effects from the medication. She has some fatigue and hair thinning.     ROS See pertinent positives and negatives per HPI.    Objective:     BP 106/60 (BP Location: Right Arm)   Pulse 86   Temp 98.4 F (36.9 C)   Ht 5\' 4"  (1.626 m)   Wt 197 lb 12.8 oz (89.7 kg)   LMP 01/27/2023   SpO2 97%   BMI 33.95 kg/m    Physical Exam Vitals and nursing note reviewed.  Constitutional:      General: She is not in acute distress.    Appearance: Normal appearance.  HENT:     Head: Normocephalic.     Right Ear: Tympanic membrane, ear canal and external ear normal.     Left Ear: Tympanic membrane, ear canal and external ear normal.  Eyes:     Conjunctiva/sclera: Conjunctivae normal.  Cardiovascular:     Rate and Rhythm: Normal rate and regular rhythm.     Pulses: Normal pulses.     Heart sounds: Normal heart sounds.  Pulmonary:     Effort: Pulmonary effort is normal.     Breath sounds: Normal breath sounds.  Musculoskeletal:     Cervical back: Normal range of motion and neck supple. No tenderness.  Lymphadenopathy:     Cervical: No cervical adenopathy.  Skin:    General: Skin is warm.  Neurological:     General: No focal deficit present.     Mental Status: She is alert and oriented to person, place, and time.   Psychiatric:        Mood and Affect: Mood normal.        Behavior: Behavior normal.        Thought Content: Thought content normal.        Judgment: Judgment normal.      Assessment & Plan:   Problem List Items Addressed This Visit       Endocrine   Hypothyroidism - Primary    Chronic, stable.  Continue levothyroxine 50 mcg daily.  Check TSH today and adjust regimen based on results.      Relevant Medications   levothyroxine (SYNTHROID) 50 MCG tablet   Other Relevant Orders   TSH     Other   Pure hypercholesterolemia    Chronic, stable.  Check CMP, CBC, lipid panel today.  Treat based on results.      Relevant Orders   CBC with Differential/Platelet   Comprehensive metabolic panel   Lipid panel   Environmental and seasonal allergies    Chronic, ongoing.  Will refill her montelukast 10 mg daily.  She can also continue to take Zyrtec 10 mg daily as needed.       Return  if symptoms worsen or fail to improve.    Gerre Scull, NP

## 2023-02-01 NOTE — Progress Notes (Signed)
Because we can not order labs to refill your medication- you need to be seen by your PCP for this. If you have a more pressing concern- you will need to be seen at a local Urgent Care. I recommend that you be seen in a face to face visit.   NOTE: There will be NO CHARGE for this eVisit

## 2023-02-01 NOTE — Assessment & Plan Note (Signed)
Chronic, stable. Check CMP, CBC, lipid panel today. Treat based on results.

## 2023-02-02 ENCOUNTER — Encounter: Payer: Self-pay | Admitting: Nurse Practitioner

## 2023-02-02 LAB — COMPREHENSIVE METABOLIC PANEL
ALT: 20 U/L (ref 0–35)
AST: 16 U/L (ref 0–37)
Albumin: 4.3 g/dL (ref 3.5–5.2)
Alkaline Phosphatase: 46 U/L (ref 39–117)
BUN: 20 mg/dL (ref 6–23)
CO2: 27 mEq/L (ref 19–32)
Calcium: 9.8 mg/dL (ref 8.4–10.5)
Chloride: 104 mEq/L (ref 96–112)
Creatinine, Ser: 0.86 mg/dL (ref 0.40–1.20)
GFR: 90.49 mL/min (ref >60.00)
Glucose, Bld: 84 mg/dL (ref 70–99)
Potassium: 4.2 mEq/L (ref 3.5–5.1)
Sodium: 139 mEq/L (ref 135–145)
Total Bilirubin: 0.3 mg/dL (ref 0.2–1.2)
Total Protein: 6.7 g/dL (ref 6.0–8.3)

## 2023-02-02 LAB — LIPID PANEL
Cholesterol: 202 mg/dL — ABNORMAL HIGH (ref 0–200)
HDL: 53.9 mg/dL (ref >39.00)
LDL Cholesterol: 118 mg/dL — ABNORMAL HIGH (ref 0–99)
NonHDL: 147.94
Total CHOL/HDL Ratio: 4
Triglycerides: 151 mg/dL — ABNORMAL HIGH (ref 0.0–149.0)
VLDL: 30.2 mg/dL (ref 0.0–40.0)

## 2023-02-17 ENCOUNTER — Encounter (INDEPENDENT_AMBULATORY_CARE_PROVIDER_SITE_OTHER): Payer: Self-pay

## 2023-03-08 ENCOUNTER — Encounter: Payer: Self-pay | Admitting: Nurse Practitioner

## 2023-03-08 ENCOUNTER — Ambulatory Visit (INDEPENDENT_AMBULATORY_CARE_PROVIDER_SITE_OTHER): Payer: 59 | Admitting: Nurse Practitioner

## 2023-03-08 VITALS — BP 108/72 | HR 80 | Temp 97.5°F | Ht 64.0 in | Wt 193.2 lb

## 2023-03-08 DIAGNOSIS — Z0000 Encounter for general adult medical examination without abnormal findings: Secondary | ICD-10-CM | POA: Diagnosis not present

## 2023-03-08 DIAGNOSIS — E039 Hypothyroidism, unspecified: Secondary | ICD-10-CM

## 2023-03-08 DIAGNOSIS — E7800 Pure hypercholesterolemia, unspecified: Secondary | ICD-10-CM | POA: Diagnosis not present

## 2023-03-08 DIAGNOSIS — F32A Depression, unspecified: Secondary | ICD-10-CM | POA: Diagnosis not present

## 2023-03-08 DIAGNOSIS — J3089 Other allergic rhinitis: Secondary | ICD-10-CM

## 2023-03-08 DIAGNOSIS — F419 Anxiety disorder, unspecified: Secondary | ICD-10-CM

## 2023-03-08 MED ORDER — ALBUTEROL SULFATE HFA 108 (90 BASE) MCG/ACT IN AERS: 1.0000 | INHALATION_SPRAY | RESPIRATORY_TRACT | 1 refills | Status: AC | PRN

## 2023-03-08 NOTE — Assessment & Plan Note (Signed)
Chronic, stable. Continue levothyroxine 50mcg daily.  ?

## 2023-03-08 NOTE — Assessment & Plan Note (Signed)
Health maintenance reviewed and updated. Discussed nutrition, exercise. Recent labs reviewed. Follow-up 1 year 

## 2023-03-08 NOTE — Assessment & Plan Note (Signed)
Chronic, stable. Continue going to therapy as needed.

## 2023-03-08 NOTE — Progress Notes (Signed)
BP 108/72 (BP Location: Right Arm)   Pulse 80   Temp (!) 97.5 F (36.4 C)   Ht 5\' 4"  (1.626 m)   Wt 193 lb 3.2 oz (87.6 kg)   LMP 02/26/2023 (Exact Date)   SpO2 96%   Breastfeeding No   BMI 33.16 kg/m    Subjective:    Patient ID: Michele Bullock, female    DOB: 09/15/1990, 32 y.o.   MRN: [id number]  CC: Chief Complaint  Patient presents with   Annual Exam    Discuss recent lab work, no concerns    HPI: Michele Bullock is a 32 y.o. female presenting on 03/08/2023 for comprehensive medical examination. Current medical complaints include:none  She currently lives with: boyfriend Menopausal Symptoms: no  Depression and Anxiety Screen done today and results listed below:     03/08/2023   10:34 AM 02/01/2023    2:30 PM 03/06/2022    4:54 PM 01/23/2022    4:08 PM 06/26/2019    3:44 PM  Depression screen PHQ 2/9  Decreased Interest 1 0 0 0 2  Down, Depressed, Hopeless 0 1 0 0 2  PHQ - 2 Score 1 1 0 0 4  Altered sleeping  0 0 0 2  Tired, decreased energy 1 1 0 0 3  Change in appetite 0 0 0 0 3  Feeling bad or failure about yourself  0 0 0 0 2  Trouble concentrating 1 1 0  3  Moving slowly or fidgety/restless 0 1 0 0 3  Suicidal thoughts 0 0 0 0 1  PHQ-9 Score  4 0 0 21  Difficult doing work/chores  Not difficult at all Not difficult at all Somewhat difficult       03/08/2023   10:35 AM 02/01/2023    2:30 PM 03/06/2022    4:55 PM 01/23/2022    4:08 PM  GAD 7 : Generalized Anxiety Score  Nervous, Anxious, on Edge 1 1 0 2  Control/stop worrying 1 1 0 1  Worry too much - different things 0 1 0 1  Trouble relaxing 1 1 0 0  Restless 1 0 0 1  Easily annoyed or irritable 1 1 1 1   Afraid - awful might happen 2 2 0 1  Total GAD 7 Score 7 7 1 7   Anxiety Difficulty Somewhat difficult Somewhat difficult Not difficult at all Somewhat difficult    The patient does not have a history of falls. I did not complete a risk assessment for falls. A plan of care for falls was not  documented.   Past Medical History:  Past Medical History:  Diagnosis Date   Allergy Idk last couple years   Anxiety    Depression 2011   Before diagnosing my hypothyroidism, not since ors regulated   GERD (gastroesophageal reflux disease)    More recently and since I have quit drinking oddly enough.   Hypothyroidism    Miscarriage 07/20/2009   Thyroid disease     Surgical History:  Past Surgical History:  Procedure Laterality Date   DILATION AND EVACUATION N/A 11/03/2013   Procedure: DILATATION AND EVACUATION with genetic studies;  Surgeon: Mitchel Honour, DO;  Location: [facility];  Service: Gynecology;  Laterality: N/A;  with genetic studies   TONSILLECTOMY      Medications:  Current Outpatient Medications on File Prior to Visit  Medication Sig   levothyroxine (SYNTHROID) 50 MCG tablet Take 1 tablet (50 mcg total) by mouth daily before breakfast.  montelukast (SINGULAIR) 10 MG tablet Take 1 tablet (10 mg total) by mouth at bedtime.   triamcinolone cream (KENALOG) 0.1 % Apply 1 Application topically 2 (two) times daily.   cetirizine (ZYRTEC) 10 MG chewable tablet Chew 10 mg by mouth daily. (Patient not taking: Reported on 02/01/2023)   LORazepam (ATIVAN) 1 MG tablet Take 1 mg by mouth daily as needed. (Patient not taking: Reported on 02/01/2023)   No current facility-administered medications on file prior to visit.    Allergies:  Allergies  Allergen Reactions   Valtrex [Valacyclovir Hcl] Anaphylaxis    Social History:  Social History   Socioeconomic History   Marital status: Single    Spouse name: Not on file   Number of children: Not on file   Years of education: Not on file   Highest education level: Associate degree: occupational, Scientist, product/process development, or vocational program  Occupational History   Not on file  Tobacco Use   Smoking status: Former    Current packs/day: 0.00    Types: Cigarettes, E-cigarettes    Quit date: 01/31/2012    Years since quitting: 11.1    Smokeless tobacco: Never   Tobacco comments:    Smoked pretty heavy from [age] then vapes heavily to quit. Now pouches to taper off all nicotine.  Substance and Sexual Activity   Alcohol use: Yes    Comment: socially   Drug use: Yes    Types: Marijuana   Sexual activity: Yes    Birth control/protection: I.U.D.  Other Topics Concern   Not on file  Social History Narrative   ** Merged History Encounter **       Social Determinants of Health   Financial Resource Strain: Patient Declined (02/01/2023)   Overall Financial Resource Strain (CARDIA)    Difficulty of Paying Living Expenses: Patient declined  Food Insecurity: No Food Insecurity (02/01/2023)   Hunger Vital Sign    Worried About Running Out of Food in the Last Year: Never true    Ran Out of Food in the Last Year: Never true  Transportation Needs: No Transportation Needs (02/01/2023)   PRAPARE - Administrator, Civil Service (Medical): No    Lack of Transportation (Non-Medical): No  Physical Activity: Sufficiently Active (02/01/2023)   Exercise Vital Sign    Days of Exercise per Week: 3 days    Minutes of Exercise per Session: 50 min  Stress: Stress Concern Present (04/14/2021)   Received from [facility], [facility] of Occup[facility] - Occupational Stress Questionnaire    Feeling of Stress : Very much  Social Connections: Socially Isolated (02/01/2023)   Social Connection and Isolation Panel [NHANES]    Frequency of Communication with Friends and Family: Once a week    Frequency of Social Gatherings with Friends and Family: Once a week    Attends Religious Services: Never    Database administrator or Organizations: No    Attends Engineer, structural: Not on file    Marital Status: Living with partner  Intimate Partner Violence: Unknown (10/24/2021)   Received from [employer], [facility]   HITS    Physically Hurt: Not on file    Insult or Talk Down To: Not on file     Threaten Physical Harm: Not on file    Scream or Curse: Not on file   Social History   Tobacco Use  Smoking Status Former   Current packs/day: 0.00   Types: Cigarettes, E-cigarettes  Quit date: 01/31/2012   Years since quitting: 11.1  Smokeless Tobacco Never  Tobacco Comments   Smoked pretty heavy from [age] then vapes heavily to quit. Now pouches to taper off all nicotine.   Social History   Substance and Sexual Activity  Alcohol Use Yes   Comment: socially    Family History:  Family History  Problem Relation Age of Onset   Hypothyroidism Maternal Aunt    ADD / ADHD Mother    Depression Mother    Hypertension Mother    Diabetes Maternal Grandfather    Heart disease Maternal Grandfather    Hypertension Maternal Grandfather    Obesity Maternal Grandfather    Varicose Veins Maternal Grandmother    COPD Paternal Grandmother    ADD / ADHD Sister    Depression Sister    Depression Maternal Aunt     Past medical history, surgical history, medications, allergies, family history and social history reviewed with patient today and changes made to appropriate areas of the chart.   Review of Systems  Constitutional: Negative.   HENT: Negative.    Respiratory: Negative.    Cardiovascular: Negative.   Gastrointestinal: Negative.   Genitourinary: Negative.   Musculoskeletal:        Random pains on side, right chest for a few seconds the past few days  Skin: Negative.   Neurological: Negative.   Psychiatric/Behavioral:  The patient is nervous/anxious (stable).    All other ROS negative except what is listed above and in the HPI.      Objective:    BP 108/72 (BP Location: Right Arm)   Pulse 80   Temp (!) 97.5 F (36.4 C)   Ht 5\' 4"  (1.626 m)   Wt 193 lb 3.2 oz (87.6 kg)   LMP 02/26/2023 (Exact Date)   SpO2 96%   Breastfeeding No   BMI 33.16 kg/m   Wt Readings from Last 3 Encounters:  03/08/23 193 lb 3.2 oz (87.6 kg)  02/01/23 197 lb 12.8 oz (89.7 kg)  03/06/22  184 lb (83.5 kg)    Physical Exam Vitals and nursing note reviewed.  Constitutional:      General: She is not in acute distress.    Appearance: Normal appearance.  HENT:     Head: Normocephalic and atraumatic.     Right Ear: Tympanic membrane, ear canal and external ear normal.     Left Ear: Tympanic membrane, ear canal and external ear normal.  Eyes:     Conjunctiva/sclera: Conjunctivae normal.  Cardiovascular:     Rate and Rhythm: Normal rate and regular rhythm.     Pulses: Normal pulses.     Heart sounds: Normal heart sounds.  Pulmonary:     Effort: Pulmonary effort is normal.     Breath sounds: Normal breath sounds.  Abdominal:     Palpations: Abdomen is soft.     Tenderness: There is no abdominal tenderness.  Musculoskeletal:        General: Normal range of motion.     Cervical back: Normal range of motion and neck supple.     Right lower leg: No edema.     Left lower leg: No edema.  Lymphadenopathy:     Cervical: No cervical adenopathy.  Skin:    General: Skin is warm and dry.  Neurological:     General: No focal deficit present.     Mental Status: She is alert and oriented to person, place, and time.     Cranial Nerves: No  cranial nerve deficit.     Coordination: Coordination normal.     Gait: Gait normal.  Psychiatric:        Mood and Affect: Mood normal.        Behavior: Behavior normal.        Thought Content: Thought content normal.        Judgment: Judgment normal.     Results for orders placed or performed in visit on 02/01/23  [facility] HIV SCREENING LAB  Result Value Ref Range   HM HIV Screening Negative - Validated   [facility] HEPATITIS C SCREENING LAB  Result Value Ref Range   [facility] Hepatitis Screen Negative-Validated   CBC with Differential/Platelet  Result Value Ref Range   WBC 7.9 4.0 - 10.5 K/uL   RBC 4.68 3.87 - 5.11 Mil/uL   Hemoglobin 14.0 12.0 - 15.0 g/dL   HCT 47.8 29.5 - 62.1 %   MCV 89.7 78.0 - 100.0 fl   MCHC 33.4 30.0 - 36.0 g/dL   RDW 30.8  65.7 - 84.6 %   Platelets 351.0 150.0 - 400.0 K/uL   Neutrophils Relative % 45.8 43.0 - 77.0 %   Lymphocytes Relative 43.3 12.0 - 46.0 %   Monocytes Relative 7.5 3.0 - 12.0 %   Eosinophils Relative 2.3 0.0 - 5.0 %   Basophils Relative 1.1 0.0 - 3.0 %   Neutro Abs 3.6 1.4 - 7.7 K/uL   Lymphs Abs 3.4 0.7 - 4.0 K/uL   Monocytes Absolute 0.6 0.1 - 1.0 K/uL   Eosinophils Absolute 0.2 0.0 - 0.7 K/uL   Basophils Absolute 0.1 0.0 - 0.1 K/uL  Comprehensive metabolic panel  Result Value Ref Range   Sodium 139 135 - 145 mEq/L   Potassium 4.2 3.5 - 5.1 mEq/L   Chloride 104 96 - 112 mEq/L   CO2 27 19 - 32 mEq/L   Glucose, Bld 84 70 - 99 mg/dL   BUN 20 6 - 23 mg/dL   Creatinine, Ser 9.62 0.40 - 1.20 mg/dL   Total Bilirubin 0.3 0.2 - 1.2 mg/dL   Alkaline Phosphatase 46 39 - 117 U/L   AST 16 0 - 37 U/L   ALT 20 0 - 35 U/L   Total Protein 6.7 6.0 - 8.3 g/dL   Albumin 4.3 3.5 - 5.2 g/dL   GFR 95.28 >41.32 mL/min   Calcium 9.8 8.4 - 10.5 mg/dL  Lipid panel  Result Value Ref Range   Cholesterol 202 (H) 0 - 200 mg/dL   Triglycerides 440.1 (H) 0.0 - 149.0 mg/dL   HDL 02.72 >53.66 mg/dL   VLDL 44.0 0.0 - 34.7 mg/dL   LDL Cholesterol 425 (H) 0 - 99 mg/dL   Total CHOL/HDL Ratio 4    NonHDL 147.94   TSH  Result Value Ref Range   TSH 1.65 0.35 - 5.50 uIU/mL      Assessment & Plan:   Problem List Items Addressed This Visit       Endocrine   Hypothyroidism    Chronic, stable. Continue levothyroxine daily.         Other   Anxiety and depression    Chronic, stable. Continue going to therapy as needed.       Pure hypercholesterolemia    LDL was 118. Discussed nutrition and exercise. Follow-up 1 year.       Environmental and seasonal allergies    Chronic, stable. Continue montelukast 10mg  daily.       Routine general medical examination at a  health care facility - Primary    Health maintenance reviewed and updated. Discussed nutrition, exercise. Recent labs reviewed.  Follow-up 1 year.          Follow up plan: Return in about 1 year (around 03/07/2024) for CPE.   LABORATORY TESTING:  - Pap smear: up to date  IMMUNIZATIONS:   - Tdap: Tetanus vaccination status reviewed: last tetanus booster within 10 years. - Influenza: Postponed to flu season - Pneumovax: Not applicable - Prevnar: Not applicable - HPV: Not applicable - Shingrix vaccine: Not applicable  SCREENING: -Mammogram: Not applicable  - Colonoscopy: Not applicable  - Bone Density: Not applicable   PATIENT COUNSELING:   Advised to take 1 mg of folate supplement per day if capable of pregnancy.   Sexuality: Discussed sexually transmitted diseases, partner selection, use of condoms, avoidance of unintended pregnancy  and contraceptive alternatives.   Advised to avoid cigarette smoking.  I discussed with the patient that most people either abstain from alcohol or drink within safe limits (<=14/week and <=4 drinks/occasion for males, <=7/weeks and <= 3 drinks/occasion for females) and that the risk for alcohol disorders and other health effects rises proportionally with the number of drinks per week and how often a drinker exceeds daily limits.  Discussed cessation/primary prevention of drug use and availability of treatment for abuse.   Diet: Encouraged to adjust caloric intake to maintain  or achieve ideal body weight, to reduce intake of dietary saturated fat and total fat, to limit sodium intake by avoiding high sodium foods and not adding table salt, and to maintain adequate dietary potassium and calcium preferably from fresh fruits, vegetables, and low-fat dairy products.    stressed the importance of regular exercise  Injury prevention: Discussed safety belts, safety helmets, smoke detector, smoking near bedding or upholstery.   Dental health: Discussed importance of regular tooth brushing, flossing, and dental visits.    NEXT PREVENTATIVE PHYSICAL DUE IN 1 YEAR. Return in  about 1 year (around 03/07/2024) for CPE.  Kaysha Parsell A Deeana Atwater

## 2023-03-08 NOTE — Patient Instructions (Signed)
It was great to see you!  Try to decrease the amount of red meat, bacon, cheese that you eat.   Let's follow-up in 1 year, sooner if you have concerns.  If a referral was placed today, you will be contacted for an appointment. Please note that routine referrals can sometimes take up to 3-4 weeks to process. Please call our office if you haven't heard anything after this time frame.  Take care,  Rodman Pickle, NP

## 2023-03-08 NOTE — Assessment & Plan Note (Signed)
Chronic, stable.  Continue montelukast 10 mg daily.

## 2023-03-08 NOTE — Assessment & Plan Note (Signed)
LDL was 118. Discussed nutrition and exercise. Follow-up 1 year.

## 2023-05-12 ENCOUNTER — Other Ambulatory Visit: Payer: Self-pay | Admitting: Nurse Practitioner

## 2023-05-14 ENCOUNTER — Encounter: Payer: Self-pay | Admitting: Nurse Practitioner

## 2023-05-14 MED ORDER — LEVOTHYROXINE SODIUM 50 MCG PO TABS: 50.0000 ug | ORAL_TABLET | Freq: Every day | ORAL | 3 refills | Status: DC

## 2023-07-11 ENCOUNTER — Encounter: Payer: Self-pay | Admitting: Nurse Practitioner

## 2024-01-18 ENCOUNTER — Encounter: Payer: Self-pay | Admitting: Nurse Practitioner

## 2024-01-18 ENCOUNTER — Ambulatory Visit: Admitting: Nurse Practitioner

## 2024-01-18 VITALS — BP 120/70 | HR 79 | Temp 97.3°F | Ht 64.0 in | Wt 193.0 lb

## 2024-01-18 DIAGNOSIS — L0291 Cutaneous abscess, unspecified: Secondary | ICD-10-CM | POA: Insufficient documentation

## 2024-01-18 MED ORDER — CEPHALEXIN 500 MG PO CAPS: 500.0000 mg | ORAL_CAPSULE | Freq: Three times a day (TID) | ORAL | 0 refills | Status: AC

## 2024-01-18 NOTE — Progress Notes (Signed)
   Acute Office Visit  Subjective:     Patient ID: Michele Bullock, female    DOB: 1991/05/26, 33 y.o.   MRN: [id number]  Chief Complaint  Patient presents with   Mass    X 3-4 days, labia area     HPI Patient is in today for a painful bump in her labia area.  This started about 3-4 days ago. She states that she gets waxes regularly and had some tweezing in the area as well. She started to get pain and redness on her left labia and then noticed some swelling. It was getting larger and more painful. She used epsom salt bath, heat, and then took advice from her grandfather and tried bacon grease to the area. She states that it burned for a few minutes and then it opened and started to drain. Since then, it has been less painful, but is still red and has a small area of hardness underneath. She denies fevers.   ROS See pertinent positives and negatives per HPI.      Objective:    BP 120/70 (BP Location: Left Arm, Patient Position: Sitting, Cuff Size: Large)   Pulse 79   Temp (!) 97.3 F (36.3 C) (Temporal)   Ht 5' 4 (1.626 m)   Wt 193 lb (87.5 kg)   SpO2 98%   BMI 33.13 kg/m    Physical Exam Vitals and nursing note reviewed. Exam conducted with a chaperone present.  Constitutional:      General: She is not in acute distress.    Appearance: Normal appearance.  HENT:     Head: Normocephalic.   Eyes:     Conjunctiva/sclera: Conjunctivae normal.   Pulmonary:     Effort: Pulmonary effort is normal.   Musculoskeletal:     Cervical back: Normal range of motion.   Skin:    General: Skin is warm.     Findings: Erythema present.     Comments: Small abscess to left area, draining serosanguinous fluid, red around abscess   Neurological:     General: No focal deficit present.     Mental Status: She is alert and oriented to person, place, and time.   Psychiatric:        Mood and Affect: Mood normal.        Behavior: Behavior normal.        Thought Content: Thought content  normal.        Judgment: Judgment normal.      Assessment & Plan:   Problem List Items Addressed This Visit       Other   Abscess - Primary   Abscess x4 days, is currently draining, no areas of fluctuance. Still with surrounding erythema and pain. Will start keflex 500mg  TID x7 days. Continue warm compresses frequently to encourage draining. Wash area with soap and water BID. Follow-up if pain increases, abscess gets larger, or develop fever.        Meds ordered this encounter  Medications   cephALEXin (KEFLEX) 500 MG capsule    Sig: Take 1 capsule (500 mg total) by mouth 3 (three) times daily.    Dispense:  21 capsule    Refill:  0    Return if symptoms worsen or fail to improve.  Tinnie DELENA Harada, NP

## 2024-01-18 NOTE — Patient Instructions (Signed)
 It was great to see you!  Start keflex 3 times a day with food  Keep doing warm compresses frequently to help encourage drainage.  You can do espsom salts as well and wash with soap twice daily.   Call the office if you get increased pain, fever, or increased swelling  Let's follow-up if symptoms worsen or don't improve  Take care,  Tinnie Harada, NP

## 2024-01-18 NOTE — Assessment & Plan Note (Signed)
 Abscess x4 days, is currently draining, no areas of fluctuance. Still with surrounding erythema and pain. Will start keflex 500mg  TID x7 days. Continue warm compresses frequently to encourage draining. Wash area with soap and water BID. Follow-up if pain increases, abscess gets larger, or develop fever.

## 2024-01-31 ENCOUNTER — Encounter: Payer: Self-pay | Admitting: Nurse Practitioner

## 2024-02-01 MED ORDER — FLUCONAZOLE 150 MG PO TABS: ORAL_TABLET | ORAL | 0 refills | Status: AC

## 2024-02-24 ENCOUNTER — Other Ambulatory Visit: Payer: Self-pay | Admitting: Nurse Practitioner

## 2024-02-24 NOTE — Telephone Encounter (Signed)
 Requesting: MONTELUKAST  SOD 10 MG TABLET  Last Visit: 01/18/2024 Next Visit: Visit date not found Last Refill: 02/01/2023  Please Advise

## 2024-03-11 ENCOUNTER — Encounter: Payer: Self-pay | Admitting: Nurse Practitioner

## 2024-06-08 ENCOUNTER — Other Ambulatory Visit: Payer: Self-pay | Admitting: Nurse Practitioner

## 2024-08-21 ENCOUNTER — Other Ambulatory Visit: Payer: Self-pay | Admitting: Nurse Practitioner

## 2024-08-22 NOTE — Telephone Encounter (Signed)
 Left message for patient to return call.

## 2024-08-23 NOTE — Telephone Encounter (Signed)
 I called and spoke with patient and scheduled her an appointment.

## 2024-09-19 ENCOUNTER — Ambulatory Visit: Admitting: Nurse Practitioner
